# Patient Record
Sex: Male | Born: 1935 | Race: White | Hispanic: No | Marital: Married | Smoking: Former smoker
Health system: Southern US, Community
[De-identification: ages and names within clinical notes are randomized; demographics above are authoritative.]

## PROBLEM LIST (undated history)

## (undated) DIAGNOSIS — I350 Nonrheumatic aortic (valve) stenosis: Secondary | ICD-10-CM

## (undated) DIAGNOSIS — I340 Nonrheumatic mitral (valve) insufficiency: Secondary | ICD-10-CM

## (undated) DIAGNOSIS — I444 Left anterior fascicular block: Secondary | ICD-10-CM

## (undated) DIAGNOSIS — I6322 Cerebral infarction due to unspecified occlusion or stenosis of basilar arteries: Secondary | ICD-10-CM

## (undated) DIAGNOSIS — E785 Hyperlipidemia, unspecified: Secondary | ICD-10-CM

## (undated) DIAGNOSIS — I4510 Unspecified right bundle-branch block: Secondary | ICD-10-CM

## (undated) DIAGNOSIS — I779 Disorder of arteries and arterioles, unspecified: Secondary | ICD-10-CM

## (undated) DIAGNOSIS — M19019 Primary osteoarthritis, unspecified shoulder: Secondary | ICD-10-CM

## (undated) DIAGNOSIS — B029 Zoster without complications: Secondary | ICD-10-CM

## (undated) DIAGNOSIS — I63219 Cerebral infarction due to unspecified occlusion or stenosis of unspecified vertebral arteries: Secondary | ICD-10-CM

## (undated) DIAGNOSIS — J189 Pneumonia, unspecified organism: Secondary | ICD-10-CM

## (undated) DIAGNOSIS — F0390 Unspecified dementia without behavioral disturbance: Secondary | ICD-10-CM

## (undated) DIAGNOSIS — H43391 Other vitreous opacities, right eye: Secondary | ICD-10-CM

## (undated) DIAGNOSIS — I739 Peripheral vascular disease, unspecified: Secondary | ICD-10-CM

## (undated) HISTORY — DX: Primary osteoarthritis, unspecified shoulder: M19.019

## (undated) HISTORY — DX: Pneumonia, unspecified organism: J18.9

## (undated) HISTORY — PX: TONSILLECTOMY: SUR1361

## (undated) HISTORY — DX: Unspecified right bundle-branch block: I45.10

## (undated) HISTORY — DX: Unspecified dementia, unspecified severity, without behavioral disturbance, psychotic disturbance, mood disturbance, and anxiety: F03.90

## (undated) HISTORY — DX: Hyperlipidemia, unspecified: E78.5

## (undated) HISTORY — DX: Left anterior fascicular block: I44.4

## (undated) HISTORY — DX: Peripheral vascular disease, unspecified: I73.9

## (undated) HISTORY — DX: Cerebral infarction due to unspecified occlusion or stenosis of unspecified vertebral artery: I63.219

## (undated) HISTORY — DX: Disorder of arteries and arterioles, unspecified: I77.9

## (undated) HISTORY — DX: Other vitreous opacities, right eye: H43.391

## (undated) HISTORY — PX: AORTIC VALVE REPLACEMENT: SHX41

## (undated) HISTORY — DX: Zoster without complications: B02.9

## (undated) HISTORY — DX: Nonrheumatic mitral (valve) insufficiency: I34.0

## (undated) HISTORY — DX: Cerebral infarction due to unspecified occlusion or stenosis of basilar artery: I63.22

## (undated) HISTORY — DX: Nonrheumatic aortic (valve) stenosis: I35.0

---

## 1997-11-06 ENCOUNTER — Ambulatory Visit (HOSPITAL_COMMUNITY): Admission: RE | Admit: 1997-11-06 | Discharge: 1997-11-06 | Payer: Self-pay | Admitting: Internal Medicine

## 1997-12-04 ENCOUNTER — Ambulatory Visit (HOSPITAL_COMMUNITY): Admission: RE | Admit: 1997-12-04 | Discharge: 1997-12-04 | Payer: Self-pay | Admitting: Cardiology

## 1998-10-01 ENCOUNTER — Encounter: Payer: Self-pay | Admitting: Surgery

## 1998-10-02 ENCOUNTER — Encounter: Payer: Self-pay | Admitting: Surgery

## 1998-10-02 ENCOUNTER — Inpatient Hospital Stay (HOSPITAL_COMMUNITY): Admission: AD | Admit: 1998-10-02 | Discharge: 1998-10-08 | Payer: Self-pay | Admitting: Cardiology

## 1998-10-03 ENCOUNTER — Encounter: Payer: Self-pay | Admitting: Surgery

## 1998-10-04 ENCOUNTER — Encounter: Payer: Self-pay | Admitting: Surgery

## 1998-11-01 ENCOUNTER — Encounter: Payer: Self-pay | Admitting: Surgery

## 1998-11-01 ENCOUNTER — Encounter: Admission: RE | Admit: 1998-11-01 | Discharge: 1998-11-01 | Payer: Self-pay | Admitting: Surgery

## 2002-11-06 HISTORY — PX: COLONOSCOPY: SHX174

## 2004-10-29 ENCOUNTER — Ambulatory Visit: Payer: Self-pay | Admitting: Family Medicine

## 2005-11-02 ENCOUNTER — Ambulatory Visit: Payer: Self-pay | Admitting: Family Medicine

## 2005-11-02 LAB — CONVERTED CEMR LAB
AST: 20 units/L (ref 0–37)
CO2: 31 meq/L (ref 19–32)
Chloride: 103 meq/L (ref 96–112)
Chol/HDL Ratio, serum: 4.3
Cholesterol: 191 mg/dL (ref 0–200)
Creatinine, Ser: 1 mg/dL (ref 0.4–1.5)
HDL: 44.3 mg/dL (ref >39.0)
Hgb A1c MFr Bld: 7.2 % — ABNORMAL HIGH (ref 4.6–6.0)
LDL Cholesterol: 112 mg/dL — ABNORMAL HIGH (ref 0–99)
PSA: 0.49 ng/mL (ref 0.10–4.00)
Potassium: 4 meq/L (ref 3.5–5.1)
Sodium: 141 meq/L (ref 135–145)
Total Bilirubin: 0.5 mg/dL (ref 0.3–1.2)
Total Protein: 7.1 g/dL (ref 6.0–8.3)
Triglyceride fasting, serum: 172 mg/dL — ABNORMAL HIGH (ref 0–149)

## 2006-01-05 DIAGNOSIS — I6381 Other cerebral infarction due to occlusion or stenosis of small artery: Secondary | ICD-10-CM

## 2006-01-05 DIAGNOSIS — I63219 Cerebral infarction due to unspecified occlusion or stenosis of unspecified vertebral arteries: Secondary | ICD-10-CM

## 2006-01-05 HISTORY — DX: Cerebral infarction due to unspecified occlusion or stenosis of unspecified vertebral artery: I63.219

## 2006-01-05 HISTORY — DX: Other cerebral infarction due to occlusion or stenosis of small artery: I63.81

## 2006-03-31 ENCOUNTER — Ambulatory Visit: Payer: Self-pay | Admitting: Family Medicine

## 2006-10-08 DIAGNOSIS — E119 Type 2 diabetes mellitus without complications: Secondary | ICD-10-CM | POA: Insufficient documentation

## 2006-11-04 ENCOUNTER — Ambulatory Visit: Payer: Self-pay | Admitting: Family Medicine

## 2006-11-04 DIAGNOSIS — I38 Endocarditis, valve unspecified: Secondary | ICD-10-CM | POA: Insufficient documentation

## 2006-11-04 DIAGNOSIS — I10 Essential (primary) hypertension: Secondary | ICD-10-CM

## 2007-01-27 ENCOUNTER — Encounter: Payer: Self-pay | Admitting: Internal Medicine

## 2007-01-27 ENCOUNTER — Ambulatory Visit: Payer: Self-pay | Admitting: Cardiology

## 2007-01-27 ENCOUNTER — Inpatient Hospital Stay (HOSPITAL_COMMUNITY): Admission: EM | Admit: 2007-01-27 | Discharge: 2007-01-29 | Payer: Self-pay | Admitting: Emergency Medicine

## 2007-01-27 ENCOUNTER — Ambulatory Visit: Payer: Self-pay | Admitting: Internal Medicine

## 2007-01-27 ENCOUNTER — Encounter: Payer: Self-pay | Admitting: Family Medicine

## 2007-01-28 ENCOUNTER — Ambulatory Visit: Payer: Self-pay | Admitting: Vascular Surgery

## 2007-01-29 ENCOUNTER — Encounter: Payer: Self-pay | Admitting: Family Medicine

## 2007-02-01 ENCOUNTER — Ambulatory Visit: Payer: Self-pay | Admitting: Family Medicine

## 2007-02-01 DIAGNOSIS — I6350 Cerebral infarction due to unspecified occlusion or stenosis of unspecified cerebral artery: Secondary | ICD-10-CM

## 2007-02-02 ENCOUNTER — Encounter: Admission: RE | Admit: 2007-02-02 | Discharge: 2007-02-03 | Payer: Self-pay | Admitting: Family

## 2007-08-02 ENCOUNTER — Ambulatory Visit: Payer: Self-pay | Admitting: Family Medicine

## 2007-08-02 LAB — CONVERTED CEMR LAB: Blood Glucose, Fingerstick: 146

## 2007-08-18 ENCOUNTER — Telehealth (INDEPENDENT_AMBULATORY_CARE_PROVIDER_SITE_OTHER): Payer: Self-pay | Admitting: *Deleted

## 2007-11-15 ENCOUNTER — Encounter: Payer: Self-pay | Admitting: Family Medicine

## 2007-11-30 ENCOUNTER — Ambulatory Visit: Payer: Self-pay | Admitting: Family Medicine

## 2007-12-14 ENCOUNTER — Telehealth: Payer: Self-pay | Admitting: Family Medicine

## 2007-12-19 ENCOUNTER — Encounter: Payer: Self-pay | Admitting: Family Medicine

## 2007-12-20 ENCOUNTER — Ambulatory Visit: Payer: Self-pay | Admitting: Vascular Surgery

## 2008-01-13 ENCOUNTER — Inpatient Hospital Stay (HOSPITAL_COMMUNITY): Admission: RE | Admit: 2008-01-13 | Discharge: 2008-01-15 | Payer: Self-pay | Admitting: Vascular Surgery

## 2008-01-13 ENCOUNTER — Encounter: Payer: Self-pay | Admitting: Vascular Surgery

## 2008-01-13 ENCOUNTER — Ambulatory Visit: Payer: Self-pay | Admitting: Vascular Surgery

## 2008-01-13 HISTORY — PX: CAROTID ENDARTERECTOMY: SUR193

## 2008-01-24 ENCOUNTER — Ambulatory Visit: Payer: Self-pay | Admitting: Vascular Surgery

## 2008-01-31 ENCOUNTER — Encounter: Payer: Self-pay | Admitting: Family Medicine

## 2008-02-01 ENCOUNTER — Telehealth: Payer: Self-pay | Admitting: Family Medicine

## 2008-05-11 ENCOUNTER — Ambulatory Visit: Payer: Self-pay | Admitting: Family Medicine

## 2008-05-11 DIAGNOSIS — R209 Unspecified disturbances of skin sensation: Secondary | ICD-10-CM

## 2008-05-11 DIAGNOSIS — I739 Peripheral vascular disease, unspecified: Secondary | ICD-10-CM

## 2008-05-14 ENCOUNTER — Telehealth: Payer: Self-pay | Admitting: Family Medicine

## 2008-05-17 ENCOUNTER — Telehealth: Payer: Self-pay | Admitting: *Deleted

## 2008-05-21 ENCOUNTER — Ambulatory Visit: Payer: Self-pay

## 2008-05-21 ENCOUNTER — Encounter: Payer: Self-pay | Admitting: Family Medicine

## 2008-07-24 ENCOUNTER — Ambulatory Visit: Payer: Self-pay | Admitting: Vascular Surgery

## 2008-11-07 ENCOUNTER — Ambulatory Visit: Payer: Self-pay | Admitting: Family Medicine

## 2008-12-03 ENCOUNTER — Ambulatory Visit: Payer: Self-pay | Admitting: Family Medicine

## 2009-01-22 ENCOUNTER — Ambulatory Visit: Payer: Self-pay | Admitting: Vascular Surgery

## 2009-01-24 ENCOUNTER — Encounter: Payer: Self-pay | Admitting: Family Medicine

## 2009-01-28 ENCOUNTER — Encounter: Payer: Self-pay | Admitting: Family Medicine

## 2009-04-05 DIAGNOSIS — J189 Pneumonia, unspecified organism: Secondary | ICD-10-CM

## 2009-04-05 HISTORY — DX: Pneumonia, unspecified organism: J18.9

## 2009-04-08 ENCOUNTER — Inpatient Hospital Stay (HOSPITAL_COMMUNITY): Admission: EM | Admit: 2009-04-08 | Discharge: 2009-04-10 | Payer: Self-pay | Admitting: Emergency Medicine

## 2009-04-17 ENCOUNTER — Ambulatory Visit: Payer: Self-pay | Admitting: Family Medicine

## 2009-04-17 DIAGNOSIS — J189 Pneumonia, unspecified organism: Secondary | ICD-10-CM | POA: Insufficient documentation

## 2009-04-17 DIAGNOSIS — J984 Other disorders of lung: Secondary | ICD-10-CM

## 2009-04-22 ENCOUNTER — Ambulatory Visit: Payer: Self-pay | Admitting: Cardiology

## 2009-04-25 ENCOUNTER — Telehealth: Payer: Self-pay | Admitting: Family Medicine

## 2009-04-29 ENCOUNTER — Encounter: Payer: Self-pay | Admitting: Family Medicine

## 2009-05-14 ENCOUNTER — Ambulatory Visit: Payer: Self-pay | Admitting: Family Medicine

## 2009-05-21 ENCOUNTER — Encounter: Payer: Self-pay | Admitting: Family Medicine

## 2009-05-23 ENCOUNTER — Encounter: Payer: Self-pay | Admitting: Family Medicine

## 2009-05-23 ENCOUNTER — Ambulatory Visit: Payer: Self-pay

## 2009-05-29 ENCOUNTER — Telehealth: Payer: Self-pay | Admitting: Family Medicine

## 2009-09-23 ENCOUNTER — Ambulatory Visit: Payer: Self-pay | Admitting: Family Medicine

## 2009-09-23 DIAGNOSIS — R42 Dizziness and giddiness: Secondary | ICD-10-CM

## 2009-09-25 LAB — CONVERTED CEMR LAB
ALT: 20 units/L (ref 0–53)
AST: 27 units/L (ref 0–37)
BUN: 19 mg/dL (ref 6–23)
Bilirubin, Direct: 0.1 mg/dL (ref 0.0–0.3)
Calcium: 9.2 mg/dL (ref 8.4–10.5)
Creatinine, Ser: 1 mg/dL (ref 0.4–1.5)
Eosinophils Relative: 0.5 % (ref 0.0–5.0)
GFR calc non Af Amer: 78 mL/min (ref >60)
Lymphocytes Relative: 28.7 % (ref 12.0–46.0)
Monocytes Relative: 7.8 % (ref 3.0–12.0)
Neutrophils Relative %: 62.8 % (ref 43.0–77.0)
Platelets: 167 10*3/uL (ref 150.0–400.0)
Potassium: 4.3 meq/L (ref 3.5–5.1)
Total Bilirubin: 0.5 mg/dL (ref 0.3–1.2)
WBC: 7.8 10*3/uL (ref 4.5–10.5)

## 2009-12-05 ENCOUNTER — Ambulatory Visit: Payer: Self-pay | Admitting: Family Medicine

## 2009-12-05 DIAGNOSIS — N401 Enlarged prostate with lower urinary tract symptoms: Secondary | ICD-10-CM

## 2009-12-06 LAB — CONVERTED CEMR LAB
AST: 21 units/L (ref 0–37)
Albumin: 4.3 g/dL (ref 3.5–5.2)
Alkaline Phosphatase: 69 units/L (ref 39–117)
Basophils Absolute: 0 10*3/uL (ref 0.0–0.1)
Bilirubin, Direct: 0.1 mg/dL (ref 0.0–0.3)
Calcium: 9.4 mg/dL (ref 8.4–10.5)
Creatinine, Ser: 1.1 mg/dL (ref 0.4–1.5)
Eosinophils Absolute: 0.1 10*3/uL (ref 0.0–0.7)
GFR calc non Af Amer: 69.62 mL/min (ref >60)
Glucose, Bld: 161 mg/dL — ABNORMAL HIGH (ref 70–99)
HDL: 42.9 mg/dL (ref >39.00)
Hemoglobin: 12.8 g/dL — ABNORMAL LOW (ref 13.0–17.0)
Hgb A1c MFr Bld: 7.2 % — ABNORMAL HIGH (ref 4.6–6.5)
Lymphocytes Relative: 30.7 % (ref 12.0–46.0)
Monocytes Relative: 7.1 % (ref 3.0–12.0)
Neutro Abs: 4.1 10*3/uL (ref 1.4–7.7)
Neutrophils Relative %: 60.7 % (ref 43.0–77.0)
RDW: 14.5 % (ref 11.5–14.6)
Sodium: 138 meq/L (ref 135–145)
Total Bilirubin: 0.2 mg/dL — ABNORMAL LOW (ref 0.3–1.2)
Total CHOL/HDL Ratio: 3
Triglycerides: 106 mg/dL (ref 0.0–149.0)
VLDL: 21.2 mg/dL (ref 0.0–40.0)

## 2010-01-17 ENCOUNTER — Ambulatory Visit
Admission: RE | Admit: 2010-01-17 | Discharge: 2010-01-17 | Payer: Self-pay | Source: Home / Self Care | Attending: Vascular Surgery | Admitting: Vascular Surgery

## 2010-02-02 LAB — CONVERTED CEMR LAB
ALT: 13 units/L (ref 0–53)
ALT: 17 units/L (ref 0–53)
AST: 21 units/L (ref 0–37)
Albumin: 4.4 g/dL (ref 3.5–5.2)
Alkaline Phosphatase: 57 units/L (ref 39–117)
BUN: 17 mg/dL (ref 6–23)
Basophils Relative: 0.3 % (ref 0.0–1.0)
Bilirubin Urine: NEGATIVE
Bilirubin, Direct: 0.1 mg/dL (ref 0.0–0.3)
Bilirubin, Direct: 0.2 mg/dL (ref 0.0–0.3)
CO2: 29 meq/L (ref 19–32)
Calcium: 9.8 mg/dL (ref 8.4–10.5)
Chloride: 101 meq/L (ref 96–112)
Chloride: 104 meq/L (ref 96–112)
Cholesterol: 190 mg/dL (ref 0–200)
Creatinine, Ser: 0.9 mg/dL (ref 0.4–1.5)
Eosinophils Absolute: 0 10*3/uL (ref 0.0–0.7)
Eosinophils Relative: 0.6 % (ref 0.0–5.0)
Eosinophils Relative: 0.9 % (ref 0.0–5.0)
GFR calc Af Amer: 103 mL/min
GFR calc non Af Amer: 85 mL/min
GFR calc non Af Amer: 86 mL/min
Glucose, Bld: 141 mg/dL — ABNORMAL HIGH (ref 70–99)
Glucose, Bld: 143 mg/dL — ABNORMAL HIGH (ref 70–99)
HCT: 40.1 % (ref 39.0–52.0)
HCT: 40.6 % (ref 39.0–52.0)
HCT: 40.8 % (ref 39.0–52.0)
HDL: 47.1 mg/dL (ref >39.0)
Hgb A1c MFr Bld: 6.8 % — ABNORMAL HIGH (ref 4.6–6.5)
Hgb A1c MFr Bld: 7.1 % — ABNORMAL HIGH (ref 4.6–6.0)
Hgb A1c MFr Bld: 7.6 % — ABNORMAL HIGH (ref 4.6–6.0)
Ketones, urine, test strip: NEGATIVE
Ketones, urine, test strip: NEGATIVE
LDL Cholesterol: 113 mg/dL — ABNORMAL HIGH (ref 0–99)
Lymphs Abs: 1.8 10*3/uL (ref 0.7–4.0)
MCV: 88.9 fL (ref 78.0–100.0)
MCV: 90.5 fL (ref 78.0–100.0)
MCV: 94.5 fL (ref 78.0–100.0)
Microalb Creat Ratio: 24.9 mg/g (ref 0.0–30.0)
Monocytes Absolute: 0.4 10*3/uL (ref 0.1–1.0)
Monocytes Absolute: 0.5 10*3/uL (ref 0.1–1.0)
Monocytes Relative: 7.5 % (ref 3.0–12.0)
Neutrophils Relative %: 58.8 % (ref 43.0–77.0)
Neutrophils Relative %: 61.3 % (ref 43.0–77.0)
Nitrite: POSITIVE
Nitrite: POSITIVE
PSA: 0.45 ng/mL (ref 0.10–4.00)
Platelets: 166 10*3/uL (ref 150.0–400.0)
Platelets: 168 10*3/uL (ref 150–400)
Platelets: 189 10*3/uL (ref 150–400)
Potassium: 4.6 meq/L (ref 3.5–5.1)
Potassium: 4.9 meq/L (ref 3.5–5.1)
RBC: 4.59 M/uL (ref 4.22–5.81)
RDW: 13.3 % (ref 11.5–14.6)
RDW: 13.3 % (ref 11.5–14.6)
Sodium: 141 meq/L (ref 135–145)
Sodium: 144 meq/L (ref 135–145)
Specific Gravity, Urine: 1.02
Specific Gravity, Urine: 1.025
TSH: 0.24 microintl units/mL — ABNORMAL LOW (ref 0.35–5.50)
TSH: 0.99 microintl units/mL (ref 0.35–5.50)
Total Bilirubin: 0.7 mg/dL (ref 0.3–1.2)
Total Bilirubin: 0.9 mg/dL (ref 0.3–1.2)
Total CHOL/HDL Ratio: 3.7
Total CHOL/HDL Ratio: 3.9
Total Protein: 6.9 g/dL (ref 6.0–8.3)
Total Protein: 7.2 g/dL (ref 6.0–8.3)
Triglycerides: 129 mg/dL (ref 0–149)
Triglycerides: 141 mg/dL (ref 0–149)
Urobilinogen, UA: 0.2
VLDL: 26 mg/dL (ref 0–40)
VLDL: 28 mg/dL (ref 0–40)
WBC: 6 10*3/uL (ref 4.5–10.5)
WBC: 6.5 10*3/uL (ref 4.5–10.5)
pH: 7

## 2010-02-04 NOTE — Assessment & Plan Note (Signed)
Summary: POST HOSP F/U (PNEUMONIA) // RS   Vital Signs:  Patient profile:   77 year old male Weight:      160 pounds O2 Sat:      97 % on Room air Temp:     97.9 degrees F oral Pulse rate:   84 / minute Pulse rhythm:   regular BP sitting:   128 / 50  (left arm) Cuff size:   regular  Vitals Entered By: Raechel Ache, RN (April 17, 2009 1:06 PM)  O2 Flow:  Room air CC: Hosp f/u; feels ok.   History of Present Illness: Here with his wife to follow up a hospital stay from 04-08-09 to 04-10-09 for a LLL pneumonia. This was treated with Avelox, and he thinks it has totally resolved. No cough or fever or chest pain or SOB. His CXR then also showed a density in the RUL which has not been seen before. This needs to be worked up further. He had some altered mental status for a few days, but this improved after the pneumonia was treated. A head CT was unremarkable.   Allergies: 1)  ! Pcn  Past History:  Past Medical History: Reviewed history from 12/03/2008 and no changes required. Right  Eye Floaters High Cholesterol DJD Shoulder Severe Aortic Stenosis, sees Dr. Peter [place] Mitral Insufficiency  RBBB & LAFB shingles Diabetes mellitus, type II carotid artery disease left thalamic stroke 1-08   Past Surgical History: Reviewed history from 05/11/2008 and no changes required. Tonsillectomy Colonoscopy 11-04 per Dr. Jarold Motto Cataract extractions per Dr. Harriette Bouillon Aortic Valve Replacement: used bovine valve, in 2000 per Dr. Laneta Simmers Carotid endarterectomy, left, 01-13-08 per Dr. Hart Rochester  Review of Systems  The patient denies anorexia, fever, weight loss, weight gain, vision loss, decreased hearing, hoarseness, chest pain, syncope, dyspnea on exertion, peripheral edema, prolonged cough, headaches, hemoptysis, abdominal pain, melena, hematochezia, severe indigestion/heartburn, hematuria, incontinence, genital sores, muscle weakness, suspicious skin lesions, transient blindness, difficulty  walking, depression, unusual weight change, abnormal bleeding, enlarged lymph nodes, angioedema, breast masses, and testicular masses.    Physical Exam  General:  Well-developed,well-nourished,in no acute distress; alert,appropriate and cooperative throughout examination Neck:  No deformities, masses, or tenderness noted. Lungs:  Normal respiratory effort, chest expands symmetrically. Lungs are clear to auscultation, no crackles or wheezes. Heart:  Normal rate and regular rhythm. S1 and S2 normal without gallop, murmur, click, rub or other extra sounds. Neurologic:  alert & oriented X3, cranial nerves II-XII intact, and gait normal.     Impression & Recommendations:  Problem # 1:  PNEUMONIA (ICD-486)  Problem # 2:  PULMONARY NODULE (WJX-914.78)  Orders: Radiology Referral (Radiology)  Problem # 3:  DIABETES MELLITUS, TYPE II (ICD-250.00)  His updated medication list for this problem includes:    Glyburide 5 Mg Tabs (Glyburide) .Marland Kitchen..Marland Kitchen Two times a day    Lisinopril 20 Mg Tabs (Lisinopril) .Marland Kitchen... 1 by mouth once daily    Metformin Hcl 1000 Mg Tabs (Metformin hcl) .Marland Kitchen... 1 tab two times a day    Januvia 100 Mg Tabs (Sitagliptin phosphate) ..... Once daily    Bayer Aspirin 325 Mg Tabs (Aspirin) ..... Once daily  Orders: Prescription Created Electronically [phone])  Complete Medication List: 1)  Glyburide 5 Mg Tabs (Glyburide) .... Two times a day 2)  Lisinopril 20 Mg Tabs (Lisinopril) .Marland Kitchen.. 1 by mouth once daily 3)  Metformin Hcl 1000 Mg Tabs (Metformin hcl) .Marland Kitchen.. 1 tab two times a day 4)  Januvia 100 Mg Tabs (  Sitagliptin phosphate) .... Once daily 5)  Onetouch Test Strp (Glucose blood) .... Use once daily 6)  Ocuvite-lutein Caps (Multiple vitamins-minerals) .Marland Kitchen.. 1 by mouth once daily 7)  Bayer Aspirin 325 Mg Tabs (Aspirin) .... Once daily 8)  Simvastatin 40 Mg Tabs (Simvastatin) .Marland Kitchen.. 1 once daily  Patient Instructions: 1)  His pneumonia seems to have resolved. We will set up a chest  CT soon to work up the lung density further. Get labs today including an A1c. Prescriptions: ONETOUCH TEST   STRP (GLUCOSE BLOOD) use once daily  #100 x 3   Entered and Authorized by:   [name] MD   Signed by:   [name] MD on 04/17/2009   Method used:   Electronically to        [facility] [phone]* (retail)       [address]       [place], [place]  [address]       Ph: [phone]       Fax: [phone]   RxID:   [id number]

## 2010-02-04 NOTE — Assessment & Plan Note (Signed)
Summary: PT FELL ON FRIDAY/PT DIZZY/CJR   Vital Signs:  Patient profile:   77 year old male Weight:      161 pounds O2 Sat:      98 % Temp:     98.2 degrees F Pulse rate:   90 / minute BP sitting:   150 / 60  (left arm)  Vitals Entered By: Pura Spice, RN (September 23, 2009 11:08 AM) CC: fell friday hit head per wife did not find any bumps on head and did not seek medical attn. c/o unsteady gait .BS running high per wife this am FBS 182   History of Present Illness: Here with his wifr for dizzy spells which he describes as the room moving around him. This is worst when he moves but he sometimes feels this even when lying still on his bed. No HA or SOB or chest pains. His  glucoses have been more labile lately, and some of his am fasting glucoses have been in the mid 200s. He has borrowed a rolling walker from a neighbor and has used this for a few days. This is very helpful for him to get around with.   Allergies: 1)  ! Pcn  Past History:  Past Medical History: Reviewed history from 12/03/2008 and no changes required. Right  Eye Floaters High Cholesterol DJD Shoulder Severe Aortic Stenosis, sees Dr. Peter [place] Mitral Insufficiency  RBBB & LAFB shingles Diabetes mellitus, type II carotid artery disease left thalamic stroke 1-08   Review of Systems  The patient denies anorexia, fever, weight loss, weight gain, vision loss, decreased hearing, hoarseness, chest pain, syncope, dyspnea on exertion, peripheral edema, prolonged cough, headaches, hemoptysis, abdominal pain, melena, hematochezia, severe indigestion/heartburn, hematuria, incontinence, genital sores, muscle weakness, suspicious skin lesions, transient blindness, difficulty walking, depression, unusual weight change, abnormal bleeding, enlarged lymph nodes, angioedema, breast masses, and testicular masses.    Physical Exam  General:  alert, walks slowly with a walker Neck:  No deformities, masses, or tenderness  noted. Lungs:  Normal respiratory effort, chest expands symmetrically. Lungs are clear to auscultation, no crackles or wheezes. Heart:  Normal rate and regular rhythm. S1 and S2 normal without gallop,  click, rub or other extra sounds. Has his usual 2/6 SM  Neurologic:  alert & oriented X3, cranial nerves II-XII intact, and strength normal in all extremities.     Impression & Recommendations:  Problem # 1:  DIZZINESS (ICD-780.4)  His updated medication list for this problem includes:    Meclizine Hcl 25 Mg Tabs (Meclizine hcl) .Marland Kitchen... 1 q 4 hours as needed dizziness  Problem # 2:  CAROTID ARTERY DISEASE (ICD-433.10)  His updated medication list for this problem includes:    Bayer Aspirin 325 Mg Tabs (Aspirin) ..... Once daily  Problem # 3:  CEREBROVASCULAR ACCIDENT (ICD-434.91)  His updated medication list for this problem includes:    Bayer Aspirin 325 Mg Tabs (Aspirin) ..... Once daily  Problem # 4:  HYPERTENSION, ESSENTIAL NOS (ICD-401.9)  His updated medication list for this problem includes:    Lisinopril 20 Mg Tabs (Lisinopril) .Marland Kitchen... 1 by mouth once daily  Problem # 5:  DIABETES MELLITUS, TYPE II (ICD-250.00)  His updated medication list for this problem includes:    Glyburide 5 Mg Tabs (Glyburide) .Marland Kitchen..Marland Kitchen Two times a day    Lisinopril 20 Mg Tabs (Lisinopril) .Marland Kitchen... 1 by mouth once daily    Metformin Hcl 1000 Mg Tabs (Metformin hcl) .Marland Kitchen... 1 tab two times a day  Januvia 100 Mg Tabs (Sitagliptin phosphate) ..... Once daily    Bayer Aspirin 325 Mg Tabs (Aspirin) ..... Once daily  Orders: Venipuncture (47829) TLB-BMP (Basic Metabolic Panel-BMET) (80048-METABOL) TLB-CBC Platelet - w/Differential (85025-CBCD) TLB-Hepatic/Liver Function Pnl (80076-HEPATIC) TLB-TSH (Thyroid Stimulating Hormone) (84443-TSH) TLB-A1C / Hgb A1C (Glycohemoglobin) (83036-A1C)  Complete Medication List: 1)  Glyburide 5 Mg Tabs (Glyburide) .... Two times a day 2)  Lisinopril 20 Mg Tabs (Lisinopril)  .Marland Kitchen.. 1 by mouth once daily 3)  Metformin Hcl 1000 Mg Tabs (Metformin hcl) .Marland Kitchen.. 1 tab two times a day 4)  Januvia 100 Mg Tabs (Sitagliptin phosphate) .... Once daily 5)  Onetouch Test Strp (Glucose blood) .... Use once daily 6)  Ocuvite-lutein Caps (Multiple vitamins-minerals) .Marland Kitchen.. 1 by mouth once daily 7)  Bayer Aspirin 325 Mg Tabs (Aspirin) .... Once daily 8)  Simvastatin 40 Mg Tabs (Simvastatin) .Marland Kitchen.. 1 once daily 9)  Meclizine Hcl 25 Mg Tabs (Meclizine hcl) .Marland Kitchen.. 1 q 4 hours as needed dizziness  Patient Instructions: 1)  get labs including an A1c. Encouraged him to use the walker at all times. Try meclizine as needed . Prescriptions: MECLIZINE HCL 25 MG TABS (MECLIZINE HCL) 1 q 4 hours as needed dizziness  #60 x 5   Entered and Authorized by:   [name] MD   Signed by:   [name] MD on 09/23/2009   Method used:   Electronically to        [facility]  [address] [phone]* (retail)       [address]       [place], [place]  [address]       Ph: [phone]       Fax: [phone]   RxID:   [id number]   Appended Document: Orders Update    Clinical Lists Changes  Orders: Added new Service order of Specimen Handling (95638) - Signed

## 2010-02-04 NOTE — Miscellaneous (Signed)
Summary: Certification and Plan of Care/[facility]  Certification and Plan of Care/[facility]   Imported By: Maryln Gottron 05/21/2009 09:59:02  _____________________________________________________________________  External Attachment:    Type:   Image     Comment:   External Document

## 2010-02-04 NOTE — Progress Notes (Signed)
Summary: returning a call  Phone Note Call from Patient Call back at Home Phone [phone]   Caller: Patient--live call Reason for Call: Talk to Nurse, Lab or Test Results Summary of Call: returning a call about doppler results? Initial call taken by: Warnell Forester,  May 29, 2009 9:57 AM  Follow-up for Phone Call        report called. Follow-up by: Raechel Ache, RN,  May 29, 2009 9:58 AM

## 2010-02-04 NOTE — Assessment & Plan Note (Signed)
Summary: CPX LABS/V70.0/CJR   Vital Signs:  Patient profile:   77 year old male Height:      70 inches Weight:      160 pounds BMI:     23.04 O2 Sat:      96 % Temp:     97.6 degrees F Pulse rate:   90 / minute BP sitting:   130 / 70  Vitals Entered By: Pura Spice, RN (December 05, 2009 8:56 AM) CC: cpx  fasting    History of Present Illness: 77 yr old male for a cpx.   Allergies: 1)  ! Pcn  Past History:  Past Medical History: Right  Eye Floaters High Cholesterol DJD Shoulder Severe Aortic Stenosis, sees Dr. Peter [place] Mitral Insufficiency  RBBB & LAFB shingles Diabetes mellitus, type II carotid artery disease, last dopplers on 05-23-09 were stable, LICA with 0-39% stenosis and RICA with 40-59% left thalamic stroke 1-08  pneumonia 04-2009  Past Surgical History: Tonsillectomy Colonoscopy 11-04 per Dr. Jarold Motto, clear, no repeats needed Cataract extractions per Dr. Harriette Bouillon Aortic Valve Replacement: used bovine valve, in 2000 per Dr. Laneta Simmers Carotid endarterectomy, left, 01-13-08 per Dr. Hart Rochester  Past History:  Care Management: Cardiology: Dr P [place]   Family History: Reviewed history from 11/04/2006 and no changes required. unremarkable  Social History: Reviewed history from 10/08/2006 and no changes required. Retired Married Former Smoker Alcohol use-no  Review of Systems  The patient denies anorexia, fever, weight loss, weight gain, vision loss, decreased hearing, hoarseness, chest pain, syncope, dyspnea on exertion, peripheral edema, prolonged cough, headaches, hemoptysis, abdominal pain, melena, hematochezia, severe indigestion/heartburn, hematuria, incontinence, genital sores, muscle weakness, suspicious skin lesions, transient blindness, difficulty walking, depression, unusual weight change, abnormal bleeding, enlarged lymph nodes, angioedema, breast masses, and testicular masses.    Physical Exam  General:  Well-developed,well-nourished,in  no acute distress; alert,appropriate and cooperative throughout examination Head:  Normocephalic and atraumatic without obvious abnormalities. No apparent alopecia or balding. Eyes:  No corneal or conjunctival inflammation noted. EOMI. Perrla. Funduscopic exam benign, without hemorrhages, exudates or papilledema. Vision grossly normal. Ears:  External ear exam shows no significant lesions or deformities.  Otoscopic examination reveals clear canals, tympanic membranes are intact bilaterally without bulging, retraction, inflammation or discharge. Hearing is grossly normal bilaterally. Nose:  External nasal examination shows no deformity or inflammation. Nasal mucosa are pink and moist without lesions or exudates. Mouth:  Oral mucosa and oropharynx without lesions or exudates.  Teeth in good repair. Neck:  No deformities, masses, or tenderness noted. No bruits  Chest Wall:  No deformities, masses, tenderness or gynecomastia noted. Lungs:  Normal respiratory effort, chest expands symmetrically. Lungs are clear to auscultation, no crackles or wheezes. Heart:  normal rate, regular rhythm, no gallop, no rub, no JVD, and no HJR.  Has his usual 2/6 SM at the aortic area  Abdomen:  Bowel sounds positive,abdomen soft and non-tender without masses, organomegaly or hernias noted. Rectal:  No external abnormalities noted. Normal sphincter tone. No rectal masses or tenderness. Heme neg. Genitalia:  Testes bilaterally descended without nodularity, tenderness or masses. No scrotal masses or lesions. No penis lesions or urethral discharge. Prostate:  no nodules, no asymmetry, no induration, and 2+ enlarged.   Msk:  No deformity or scoliosis noted of thoracic or lumbar spine.   Pulses:  R and L carotid,radial,femoral,dorsalis pedis and posterior tibial pulses are full and equal bilaterally Extremities:  No clubbing, cyanosis, edema, or deformity noted with normal full range of motion  of all joints.   Neurologic:  No  cranial nerve deficits noted. Station and gait are normal. Plantar reflexes are down-going bilaterally. DTRs are symmetrical throughout. Sensory, motor and coordinative functions appear intact. Skin:  Intact without suspicious lesions or rashes Cervical Nodes:  No lymphadenopathy noted Axillary Nodes:  No palpable lymphadenopathy Inguinal Nodes:  No significant adenopathy Psych:  Cognition and judgment appear intact. Alert and cooperative with normal attention span and concentration. No apparent delusions, illusions, hallucinations   Impression & Recommendations:  Problem # 1:  CAROTID ARTERY DISEASE (ICD-433.10)  His updated medication list for this problem includes:    Bayer Aspirin 325 Mg Tabs (Aspirin) ..... Once daily  Problem # 2:  CEREBROVASCULAR ACCIDENT (ICD-434.91)  His updated medication list for this problem includes:    Bayer Aspirin 325 Mg Tabs (Aspirin) ..... Once daily  Problem # 3:  BENIGN PROSTATIC HYPERTROPHY, WITH OBSTRUCTION (ICD-600.01)  Orders: TLB-PSA (Prostate Specific Antigen) (84153-PSA)  Problem # 4:  HYPERTENSION, ESSENTIAL NOS (ICD-401.9)  His updated medication list for this problem includes:    Lisinopril 20 Mg Tabs (Lisinopril) .Marland Kitchen... 1 by mouth once daily  Problem # 5:  DIABETES MELLITUS, TYPE II (ICD-250.00)  His updated medication list for this problem includes:    Glyburide 5 Mg Tabs (Glyburide) .Marland Kitchen..Marland Kitchen Two times a day    Lisinopril 20 Mg Tabs (Lisinopril) .Marland Kitchen... 1 by mouth once daily    Metformin Hcl 1000 Mg Tabs (Metformin hcl) .Marland Kitchen... 1 tab two times a day    Januvia 100 Mg Tabs (Sitagliptin phosphate) ..... Once daily    Bayer Aspirin 325 Mg Tabs (Aspirin) ..... Once daily  Orders: UA Dipstick w/o Micro (automated)  (81003) Venipuncture (16109) TLB-Lipid Panel (80061-LIPID) TLB-BMP (Basic Metabolic Panel-BMET) (80048-METABOL) TLB-CBC Platelet - w/Differential (85025-CBCD) TLB-Hepatic/Liver Function Pnl (80076-HEPATIC) TLB-TSH (Thyroid  Stimulating Hormone) (84443-TSH) TLB-A1C / Hgb A1C (Glycohemoglobin) (83036-A1C)  Problem # 6:  VALVULAR HEART DISEASE (ICD-424.90)  His updated medication list for this problem includes:    Bayer Aspirin 325 Mg Tabs (Aspirin) ..... Once daily  Complete Medication List: 1)  Glyburide 5 Mg Tabs (Glyburide) .... Two times a day 2)  Lisinopril 20 Mg Tabs (Lisinopril) .Marland Kitchen.. 1 by mouth once daily 3)  Metformin Hcl 1000 Mg Tabs (Metformin hcl) .Marland Kitchen.. 1 tab two times a day 4)  Januvia 100 Mg Tabs (Sitagliptin phosphate) .... Once daily 5)  Onetouch Test Strp (Glucose blood) .... Use once daily 6)  Ocuvite-lutein Caps (Multiple vitamins-minerals) .Marland Kitchen.. 1 by mouth once daily 7)  Bayer Aspirin 325 Mg Tabs (Aspirin) .... Once daily 8)  Simvastatin 40 Mg Tabs (Simvastatin) .Marland Kitchen.. 1 once daily 9)  Meclizine Hcl 25 Mg Tabs (Meclizine hcl) .Marland Kitchen.. 1 q 4 hours as needed dizziness  Patient Instructions: 1)  get fasting labs    Orders Added: 1)  Est. Patient Level IV [60454] 2)  UA Dipstick w/o Micro (automated)  [81003] 3)  Venipuncture [36415] 4)  TLB-Lipid Panel [80061-LIPID] 5)  TLB-BMP (Basic Metabolic Panel-BMET) [80048-METABOL] 6)  TLB-CBC Platelet - w/Differential [85025-CBCD] 7)  TLB-Hepatic/Liver Function Pnl [80076-HEPATIC] 8)  TLB-TSH (Thyroid Stimulating Hormone) [84443-TSH] 9)  TLB-PSA (Prostate Specific Antigen) [84153-PSA] 10)  TLB-A1C / Hgb A1C (Glycohemoglobin) [83036-A1C]   Immunization History:  Influenza Immunization History:    Influenza:  historical (12/05/2009)   Immunization History:  Influenza Immunization History:    Influenza:  Historical (12/05/2009)  Appended Document: CPX LABS/V70.0/CJR  Laboratory Results   Urine Tests    Routine Urinalysis   Color:  yellow Appearance: Clear Glucose: negative   (Normal Range: Negative) Bilirubin: negative   (Normal Range: Negative) Ketone: negative   (Normal Range: Negative) Spec. Gravity: 1.010   (Normal Range:  1.003-1.035) Blood: negative   (Normal Range: Negative) pH: 6.0   (Normal Range: 5.0-8.0) Protein: negative   (Normal Range: Negative) Urobilinogen: 0.2   (Normal Range: 0-1) Nitrite: negative   (Normal Range: Negative) Leukocyte Esterace: trace   (Normal Range: Negative)    Comments: Rita Ohara  December 05, 2009 1:28 PM

## 2010-02-04 NOTE — Progress Notes (Signed)
Summary: test results  Phone Note Call from Patient   Caller: Spouse Call For: [name] MD Summary of Call: calling for CT result. Initial call taken by: Raechel Ache, RN,  April 25, 2009 9:04 AM  Follow-up for Phone Call        see report Follow-up by: [name] MD,  April 29, 2009 9:32 AM  Additional Follow-up for Phone Call Additional follow up Details #1::        Phone Call Completed Additional Follow-up by: Raechel Ache, RN,  April 29, 2009 10:20 AM

## 2010-02-04 NOTE — Letter (Signed)
Summary: [facility]   Imported By: Maryln Gottron 01/31/2009 11:21:07  _____________________________________________________________________  External Attachment:    Type:   Image     Comment:   External Document

## 2010-02-04 NOTE — Letter (Signed)
Summary: [facility]   Imported By: Maryln Gottron 02/04/2009 11:12:03  _____________________________________________________________________  External Attachment:    Type:   Image     Comment:   External Document

## 2010-02-04 NOTE — Miscellaneous (Signed)
Summary: Orders Update  Clinical Lists Changes  Orders: Added new Test order of Carotid Duplex (Carotid Duplex) - Signed 

## 2010-03-26 LAB — LACTIC ACID, PLASMA: Lactic Acid, Venous: 1.9 mmol/L (ref 0.5–2.2)

## 2010-03-26 LAB — COMPREHENSIVE METABOLIC PANEL
AST: 15 U/L (ref 0–37)
BUN: 16 mg/dL (ref 6–23)
CO2: 24 mEq/L (ref 19–32)
Calcium: 8.5 mg/dL (ref 8.4–10.5)
Chloride: 104 mEq/L (ref 96–112)
Creatinine, Ser: 0.96 mg/dL (ref 0.4–1.5)
GFR calc non Af Amer: >60 mL/min (ref >60)
Glucose, Bld: 192 mg/dL — ABNORMAL HIGH (ref 70–99)
Total Bilirubin: 1 mg/dL (ref 0.3–1.2)

## 2010-03-26 LAB — URINE CULTURE

## 2010-03-26 LAB — IRON AND TIBC
Iron: 12 ug/dL — ABNORMAL LOW (ref 42–135)
Saturation Ratios: 5 % — ABNORMAL LOW (ref 20–55)
TIBC: 223 ug/dL (ref 215–435)
TIBC: 242 ug/dL (ref 215–435)

## 2010-03-26 LAB — GLUCOSE, CAPILLARY
Glucose-Capillary: 131 mg/dL — ABNORMAL HIGH (ref 70–99)
Glucose-Capillary: 164 mg/dL — ABNORMAL HIGH (ref 70–99)
Glucose-Capillary: 210 mg/dL — ABNORMAL HIGH (ref 70–99)
Glucose-Capillary: 211 mg/dL — ABNORMAL HIGH (ref 70–99)

## 2010-03-26 LAB — BASIC METABOLIC PANEL
CO2: 22 mEq/L (ref 19–32)
Calcium: 8.1 mg/dL — ABNORMAL LOW (ref 8.4–10.5)
Chloride: 110 mEq/L (ref 96–112)
Creatinine, Ser: 0.79 mg/dL (ref 0.4–1.5)
Glucose, Bld: 168 mg/dL — ABNORMAL HIGH (ref 70–99)

## 2010-03-26 LAB — UIFE/LIGHT CHAINS/TP QN, 24-HR UR
Free Kappa Lt Chains,Ur: 10 mg/dL — ABNORMAL HIGH (ref 0.04–1.51)
Free Lambda Lt Chains,Ur: 1.55 mg/dL — ABNORMAL HIGH (ref 0.08–1.01)
Total Protein, Urine: 13.1 mg/dL

## 2010-03-26 LAB — POCT CARDIAC MARKERS
CKMB, poc: <1 ng/mL — ABNORMAL LOW (ref 1.0–8.0)
Troponin i, poc: <0.05 ng/mL (ref 0.00–0.09)

## 2010-03-26 LAB — FERRITIN
Ferritin: 159 ng/mL (ref 22–322)
Ferritin: 169 ng/mL (ref 22–322)

## 2010-03-26 LAB — URINALYSIS, ROUTINE W REFLEX MICROSCOPIC
Glucose, UA: 500 mg/dL — AB
Nitrite: NEGATIVE
Specific Gravity, Urine: 1.02 (ref 1.005–1.030)
pH: 6.5 (ref 5.0–8.0)

## 2010-03-26 LAB — CBC
HCT: 35.9 % — ABNORMAL LOW (ref 39.0–52.0)
Hemoglobin: 12 g/dL — ABNORMAL LOW (ref 13.0–17.0)
MCV: 92.1 fL (ref 78.0–100.0)
Platelets: 119 10*3/uL — ABNORMAL LOW (ref 150–400)
RBC: 3.9 MIL/uL — ABNORMAL LOW (ref 4.22–5.81)
RDW: 14.3 % (ref 11.5–15.5)
WBC: 12.1 10*3/uL — ABNORMAL HIGH (ref 4.0–10.5)
WBC: 12.8 10*3/uL — ABNORMAL HIGH (ref 4.0–10.5)

## 2010-03-26 LAB — DIFFERENTIAL
Basophils Absolute: 0 10*3/uL (ref 0.0–0.1)
Basophils Relative: 0 % (ref 0–1)
Lymphocytes Relative: 4 % — ABNORMAL LOW (ref 12–46)
Monocytes Absolute: 0.7 10*3/uL (ref 0.1–1.0)
Neutro Abs: 11.5 10*3/uL — ABNORMAL HIGH (ref 1.7–7.7)

## 2010-03-26 LAB — PROTEIN ELECTROPHORESIS, SERUM
Albumin ELP: 55.7 % — ABNORMAL LOW (ref 55.8–66.1)
Alpha-1-Globulin: 7 % — ABNORMAL HIGH (ref 2.9–4.9)
Beta 2: 5.8 % (ref 3.2–6.5)
Total Protein ELP: 6 g/dL (ref 6.0–8.3)

## 2010-03-26 LAB — VITAMIN B12: Vitamin B-12: 152 pg/mL — ABNORMAL LOW (ref 211–911)

## 2010-03-26 LAB — FOLATE
Folate: 10.8 ng/mL
Folate: 9.5 ng/mL

## 2010-03-26 LAB — CULTURE, BLOOD (ROUTINE X 2)
Culture: NO GROWTH
Culture: NO GROWTH

## 2010-03-26 LAB — RETICULOCYTES
Retic Count, Absolute: 14 10*3/uL — ABNORMAL LOW (ref 19.0–186.0)
Retic Ct Pct: 0.4 % (ref 0.4–3.1)
Retic Ct Pct: 0.7 % (ref 0.4–3.1)

## 2010-04-21 LAB — CROSSMATCH: Antibody Screen: NEGATIVE

## 2010-04-21 LAB — ABO/RH: ABO/RH(D): O POS

## 2010-04-21 LAB — BASIC METABOLIC PANEL
BUN: 19 mg/dL (ref 6–23)
CO2: 22 mEq/L (ref 19–32)
Calcium: 7.8 mg/dL — ABNORMAL LOW (ref 8.4–10.5)
Chloride: 101 mEq/L (ref 96–112)
Creatinine, Ser: 1.26 mg/dL (ref 0.4–1.5)
GFR calc Af Amer: >60 mL/min (ref >60)
Glucose, Bld: 181 mg/dL — ABNORMAL HIGH (ref 70–99)

## 2010-04-21 LAB — URINALYSIS, ROUTINE W REFLEX MICROSCOPIC
Bilirubin Urine: NEGATIVE
Ketones, ur: NEGATIVE mg/dL
Nitrite: NEGATIVE
Protein, ur: 30 mg/dL — AB
Protein, ur: NEGATIVE mg/dL
Specific Gravity, Urine: 1.024 (ref 1.005–1.030)
Urobilinogen, UA: 0.2 mg/dL (ref 0.0–1.0)
Urobilinogen, UA: 0.2 mg/dL (ref 0.0–1.0)

## 2010-04-21 LAB — COMPREHENSIVE METABOLIC PANEL
BUN: 18 mg/dL (ref 6–23)
CO2: 28 mEq/L (ref 19–32)
Calcium: 9.7 mg/dL (ref 8.4–10.5)
Chloride: 101 mEq/L (ref 96–112)
Creatinine, Ser: 0.9 mg/dL (ref 0.4–1.5)
GFR calc Af Amer: >60 mL/min (ref >60)
GFR calc non Af Amer: >60 mL/min (ref >60)
Total Bilirubin: 0.5 mg/dL (ref 0.3–1.2)

## 2010-04-21 LAB — CBC
HCT: 41.1 % (ref 39.0–52.0)
Hemoglobin: 10.9 g/dL — ABNORMAL LOW (ref 13.0–17.0)
MCHC: 33.3 g/dL (ref 30.0–36.0)
MCHC: 33.5 g/dL (ref 30.0–36.0)
MCV: 90.4 fL (ref 78.0–100.0)
MCV: 90.6 fL (ref 78.0–100.0)
Platelets: 115 10*3/uL — ABNORMAL LOW (ref 150–400)
Platelets: 208 10*3/uL (ref 150–400)
RBC: 4.09 MIL/uL — ABNORMAL LOW (ref 4.22–5.81)
RBC: 4.54 MIL/uL (ref 4.22–5.81)
RDW: 14.2 % (ref 11.5–15.5)
RDW: 14.3 % (ref 11.5–15.5)
WBC: 17 10*3/uL — ABNORMAL HIGH (ref 4.0–10.5)
WBC: 7.2 10*3/uL (ref 4.0–10.5)

## 2010-04-21 LAB — GLUCOSE, CAPILLARY
Glucose-Capillary: 140 mg/dL — ABNORMAL HIGH (ref 70–99)
Glucose-Capillary: 163 mg/dL — ABNORMAL HIGH (ref 70–99)
Glucose-Capillary: 175 mg/dL — ABNORMAL HIGH (ref 70–99)
Glucose-Capillary: 215 mg/dL — ABNORMAL HIGH (ref 70–99)
Glucose-Capillary: 232 mg/dL — ABNORMAL HIGH (ref 70–99)
Glucose-Capillary: 249 mg/dL — ABNORMAL HIGH (ref 70–99)
Glucose-Capillary: 273 mg/dL — ABNORMAL HIGH (ref 70–99)

## 2010-04-21 LAB — URINE MICROSCOPIC-ADD ON

## 2010-04-21 LAB — APTT: aPTT: 28 seconds (ref 24–37)

## 2010-04-21 LAB — PROTIME-INR: INR: 0.9 (ref 0.00–1.49)

## 2010-05-05 ENCOUNTER — Other Ambulatory Visit: Payer: Self-pay | Admitting: *Deleted

## 2010-05-05 MED ORDER — SITAGLIPTIN PHOSPHATE 100 MG PO TABS
100.0000 mg | ORAL_TABLET | Freq: Every day | ORAL | Status: DC
Start: 2010-05-05 — End: 2011-05-11

## 2010-05-20 NOTE — Assessment & Plan Note (Signed)
OFFICE VISIT   Christian [name], Christian [name]  DOB:  05-07-1921                                       01/24/2008  CHART#:[id number]   The patient returns today 2 weeks post left carotid endarterectomy done  for severe left internal carotid stenosis with a previous history of a  left brain stroke which occurred in January of 2009.  This revealed in  right upper extremity clumsiness.  He has done very well since his left  carotid surgery with no new neurologic complications.  He is swallowing  well, has no hoarseness and has no new complaints.  He is taking Plavix  75 mg per day.   PHYSICAL EXAMINATION:  Blood pressure 136/62, heart rate 88,  respirations 14.  Left neck incision is well-healed.  Carotid pulse is  3+ with no bruit on the left, soft bruit on the right.  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   I have reassured him regarding these findings.  He will return in 6  months for followup carotid duplex exam.  He was seen by Dr. Larey Dresser as an outpatient for urinary retention and will be seeing him  again in 2 weeks but is voiding well now since his catheter was removed.   [name], M.D.  Electronically Signed   JDL/MEDQ  D:  01/24/2008  T:  01/25/2008  Job:  [id number]   cc:   Jeannett Senior A. Clent Ridges, MD  Maretta Bees. Vonita Moss, M.D.

## 2010-05-20 NOTE — Procedures (Signed)
EEG number [id number].   HISTORY:  This is an 77 year old patient with a presentation of facial  tingling and right hand numbness.  The patient is being evaluated for  the above episode.  This is a portable EEG recording.  No skull defects  noted.   MEDICATIONS:  Include Lovenox, aspirin, lisinopril, Glucophage,  glyburide, NovoLog, Reglan, and Senokot   EEG classification:  Normal awake.   DISCUSSION:  According to background rhythms, this recording system is a  very well modulated medium amplitude alpha rhythm of 9 Hz that is  reactive to eye opening and closure.  As the record progresses, the  patient appears to remain in the waking state throughout the recording.  Photic stimulation and hyperventilation were not performed.  At no time  during the record, did there appear to be evidence of spike, spike wave  discharges or evidence of focal slowing.  EKG monitor shows no evidence  of cardiac rhythm abnormalities with a heart rate of 72.   IMPRESSION:  This is a normal EEG recording in the awakened state.  No  evidence of ictal or interictal discharges were seen.      Marlan Palau, M.D.  Electronically Signed     EAV:WUJW  D:  01/28/2007 17:31:43  T:  01/29/2007 11:24:58  Job #:  [id number]

## 2010-05-20 NOTE — Discharge Summary (Signed)
Christian [name], Christian [name]                ACCOUNT NO.:  [id number]   MEDICAL RECORD NO.:  [id number]          [name] TYPE:  INP   LOCATION:  2040                         FACILITY:  [facility]   PHYSICIAN:  Quita Skye. Hart Rochester, M.D.  DATE OF BIRTH:  [birth year]   DATE OF ADMISSION:  01/13/2008  DATE OF DISCHARGE:  01/15/2008                               DISCHARGE SUMMARY   ADMISSION DIAGNOSES:  Severe left carotid occlusive disease with  previous left brain stroke.   FINAL DISCHARGE DIAGNOSES:  1. Severe left carotid occlusive disease with previous left brain      stroke status post left carotid endarterectomy.  2. Postoperative urinary retention requiring urology consult and was      sent home with Foley catheter.  3. Mild benign prostatic hypertrophy.  4. Traumatic false passage of Christian anterior urethra.  5. Diabetes mellitus type 2.  6. Hypertension.  7. History of aortic valve disease status post aortic valve      replacement.  8. Hyperlipidemia.  9. Degenerative joint disease of Christian shoulder.  10.Tonsillectomy.  11.History of cataract surgery.  12.Remote history of tobacco use.  13.Allergy to PENICILLIN.  14.Postoperative leukocytosis felt secondary to urinary tract      infection, discharged home on Cipro with plans to follow up with      Dr. Aldean Ast.   PROCEDURE:  January 13, 2008, left carotid endarterectomy with Dacron  patch angioplasty by Dr. Josephina Gip.   CONSULTANTS:  Urology, Dr. Vic Blackbird.   BRIEF HISTORY:  Christian [name] is an 77 year old Caucasian male who suffered  a left brain CVA in January 2009 consisting of right upper extremity  clumsiness and numbness as well as right perioral numbness.  Symptoms  persisted for a few months and then gradually resolved.  He has had no  further neurologic deficits since.  Workup included carotid duplex,  which showed 75% left internal carotid artery stenosis and he was  referred to Dr. Josephina Gip who recommended elective  left carotid  endarterectomy to reduce his risk for future stroke.  Of note, there was  approximately 50% right internal carotid artery stenosis.   HOSPITAL COURSE:  Christian [name] was electively admitted to [facility] on January 13, 2008.  He underwent Christian previously-mentioned  procedure.  We did have some postoperative hypertension and it was felt  that he would require even a nitroglycerin drip, but ultimately he was  sent to Christian intensive care unit in Christian meantime.  He was treated with  hydralazine and metoprolol, however.  He was neurologically intact  postoperatively.  However, he did have urinary retention postoperatively  and Christian nurses tried to pass a catheter, but were unsuccessful and did  get some blood per his urethra.  Ultimately, urology consult was  requested and apparently Dr. Aldean Ast tried a regular 16-French Foley  catheter with Xylocaine jelly, but was unable to pass Christian catheter.  He  tried with a 16 Coude, but was also unable to get that passed.  He then  did a flexible cystoscopy and was noted to have a mild false  passage  down Christian deep bulbous urethra that he was able to negotiate under direct  pressure.  Christian bladder was full, but free of any new mucosal lesions.  He was able to place a #16 Heymann catheter into Christian bladder and  received about 500 mL of output, some of which was irrigation.  We were  initially instructed to keep this till he became ambulatory; therefore,  we had him walk on postoperative day #1 and we discontinued Christian  catheter.  Unfortunately, Christian [name] was not able to void following  removal of Christian catheter.  Nursing staff again tried to insert a small  [place] catheter, but were unsuccessful.  They had to call Dr. Aldean Ast  back who was able to place a Foley using Xylocaine jelly.  He also felt  that we should not remove Christian Foley catheter instead send him home on  Flomax and had him be seen in approximately 1 week for a voiding  trial.  Of note, we also decided to start him on ciprofloxacin as his  preoperative urinalysis showed a large amount of leukocytes and his  postoperative labs showed an elevated white count up to 23,000.  We  checked this postoperative day #2 and it was down to 17,000.  We did  have another urine specimen sent and culture as well; however, Christian  culture is still pending.  His urinalysis showed a large amount of  leukocytes, no nitrates, a large amount of blood, 30 of protein, and  appearance was cloudy.  Otherwise, other results were within normal  limits.  Gram stain also showed 11-20 WBCs.  Other than his urinary  retention and since his blood pressure had stabilized, he was felt  appropriate for discharge once Foley catheter was inserted.  As of  January 10, his vitals have been stable, last blood pressure 117/59, he  has been afebrile, heart rate in Christian 90s to low 110s in a regular  rhythm, oxygen saturation 93% on room air.  Labs showed a decreasing  white count of 17,000, hemoglobin of 10.9, hematocrit of 32.9, platelet  count of 115, sodium of 132, potassium of 3.5, glucose of 181, BUN of  19, and creatinine of 1.26.  Urinalysis results as discussed above and  again his urine culture is still pending.  He has been neurologically  intact.  He has been able to eat without difficulty and pain is  controlled.  His incision is healing well without signs of infection.   DISPOSITION:  Christian [name] was felt appropriate for discharge home on  postoperative day #2, January 15, 2008.  He will be discharged home with  a Foley catheter and overnight drainage bag and leg bag with  instructions.   DISCHARGE MEDICATIONS:  1. Lisinopril 20 mg q.a.m.  2. Metformin 1000 mg b.i.d.  3. Glyburide 5 mg b.i.d.  4. Plavix 75 mg daily.  5. Januvia 100 mg daily.  6. Ocuvite Lutein every a.m.  7. Tums as needed.  8. Cipro 500 mg one p.o. b.i.d. x7 days.  9. Percocet 5/325 mg 1 tablet p.o. q.4 h. p.r.n.  pain.  10.Flomax 0.4 mg one p.o. daily   DISCHARGE INSTRUCTIONS:  He is to continue a diabetic appropriate diet.  May shower and clean his incisions gently with soap and water.  Call if  he has fever greater than 101, redness or drainage from his incision  sites or new motor speech or visual changes.  He should avoid driving or  heavy  lifting for Christian next 2 weeks.  He will see Dr.  Hart Rochester in approximately 2 weeks.  Our office will call and arrange this  followup.  He is also instructed to call Dr. Valda Lamb office at [phone] to schedule 1-week followup for a voiding trial.  He was also given  a note per Dr. Aldean Ast to instruct them to follow up on his urine  culture results.      Jerold Coombe, P.A.      [name], M.D.  Electronically Signed    AWZ/MEDQ  D:  01/15/2008  T:  01/15/2008  Job:  [id number]   cc:   Quita Skye. Hart Rochester, M.D.  Courtney Paris, M.D.  Peter M. [place]il[place], M.D.  Tera Mater. Clent Ridges, MD

## 2010-05-20 NOTE — Consult Note (Signed)
NAME:  Christian [name], Christian [name]                ACCOUNT NO.:  [id number]   MEDICAL RECORD NO.:  [id number]          PATIENT TYPE:  EMS   LOCATION:  MAJO                         FACILITY:  [facility]   PHYSICIAN:  Deanna Artis. Hickling, M.D.DATE OF BIRTH:  [birth year]   DATE OF CONSULTATION:  01/27/2007  DATE OF DISCHARGE:                                 CONSULTATION   CHIEF COMPLAINT:  Right face and arm numbness.   HISTORY OF PRESENT ILLNESS:  An 77 year old married right-handed  Caucasian male awakened this morning feeling well.  Around 8:30 he had  the onset of right-sided facial paresthesias and numbness in the right  hand and arm.  This was also paresthetic.  Symptoms have been  persistent.  The patient finally told his wife and they arrived by car  10:46 a.m.  Code stroke called 10:49 a.m. CT 11:00 a.m. in interpreted  by me 11:03 a.m.  This shows an ill-defined left parietal white matter  lesion without clear mass effect.  This is possible left remote stroke  or primary or secondary tumor.  The patient also may have a small right  infarction in the cerebellar white matter.   PAST MEDICAL HISTORY:  Positive for  1. Diabetes mellitus.  2. Hypertension.   PAST SURGICAL HISTORY:  1. Bilateral iridectomy.  2. In 2000, the patient had a bovine aortic valve replacement.   MEDICATIONS:  1. Glyburide 5 mg twice daily.  2. Metformin 1000 mg twice daily.  3. Lisinopril 20 mg daily.  4. Aspirin 81 mg daily.   DRUG ALLERGIES:  PENICILLIN.   REVIEW OF SYSTEMS:  The patient is healthy except as noted above.   FAMILY HISTORY:  Negative for stroke.  Positive for hypertension and  heart disease.   SOCIAL HISTORY:  The patient quit smoking 50 years ago.  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  No alcohol or drugs.  He is  married.  He is a [employer] Cyt[employer]ist of [employer].   PHYSICAL EXAMINATION:  VITAL SIGNS:  Pulse 85, respirations 20, pulse  oximetry 95%, temperature  97, blood pressure 203/76.  HEENT:  No bruits.  LUNGS:  Clear.  HEART:  No murmurs.  Pulses normal.  ABDOMEN:  Soft.  Bowel sounds normal.  No hepatosplenomegaly.  EXTREMITIES:  Unremarkable.  NEUROLOGIC:  The patient is alert, no dysphasia or dyspraxia.  Cranial  nerves:  Round reactive pupils, status post iridectomy.  Fundi were  normal.  Visual fields full to double simultaneous stimuli.  Symmetric  facial strength.  Diminished auditory acuity.  Midline tongue.  Motor  examination:  Normal strength.  No drift.  Fine motor movements normal.  Sensory examination:  Subjective numbness of the right face, otherwise  negative, normal stereoagnosis bilaterally.  Cerebellar examination:  Finger-to-nose and rapid repetitive movements and heel-knee-shin were  normal.  Gait was not tested.  Deep tendon reflexes were diminished.  The patient had bilateral flexor plantar responses.   IMPRESSION:  Ill-defined lesion left parietal region.  If this appears  to be a remote stroke on MRI  scan, we will admit to stroke service.  If,  however, the patient has a primary or secondary brain tumor, we will ask  [facility] to admit with a neurosurgery consult.  MRI scan  without and with contrast to be done now, EEG this afternoon.   LABORATORY STUDIES:  White blood cell count 6500, hemoglobin 14,  hematocrit 40.9, MCV 87.8, platelet count 179,0000; 60 polys, 31 lymphs,  9 monos, 1 eosinophil (total is 101) 3900 neutrophils.  PT is 12.3, INR  0.9, PTT 27.  Comprehensive metabolic panel with sodium 136, potassium  4.1, chloride 103, CO2 25, BUN 21, creatinine 1.04, GFR is greater than  60, total protein 6.6, albumin 3.8, calcium 9.8, SGOT 24, SGPT 21, total  bilirubin 0.4, alkaline phosphatase 78.  Cardiac enzymes with CK 65, CK-  MB 1.6, troponin 0.02.   EKG shows a normal sinus rhythm, left axis deviation, right bundle  branch block, ST-T wave changes present since October 02, 1998.   Inferolateral ischemia cannot be ruled out, however, from a chemical  point of view and from a symptomatic point of view, the patient does not  have substernal chest pain.  He has some aching in the right shoulder.  This does not appear to be cardiac ischemia.      Deanna Artis. Sharene Skeans, M.D.  Electronically Signed     WHH/MEDQ  D:  01/27/2007  T:  01/27/2007  Job:  [id number]   cc:   Jeannett Senior A. Clent Ridges, MD

## 2010-05-20 NOTE — H&P (Signed)
HISTORY AND PHYSICAL EXAMINATION   December 20, 2007   Re:  Christian [name], Seymour                  DOB:  [birth year]   CHIEF COMPLAINT:  Moderately severe left internal carotid stenosis  status post left brain CVA.   HISTORY OF PRESENT ILLNESS:  This 77 year old patient suffered a left  brain CVA in January of 2009 consisting of right upper extremity  clumsiness and numbness as well as right perioral numbness.  These  symptoms persisted for a few months and then gradually resolved.  He has  had no further neurologic deficits or symptoms since that time including  hemiparesis, aphasia, amaurosis fugax, diplopia, blurred vision or  syncope.  He was found to have 75% left internal carotid stenosis and is  now scheduled for an elective left carotid endarterectomy for prevention  of further stroke.   PAST MEDICAL HISTORY:  1. Non-insulin-dependent diabetes mellitus.  2. Hypertension.  3. History of aortic valve disease status post aortic valve      replacement.  4. Hyperlipidemia.  5. Degenerative joint disease in the shoulder.  6. Negative for coronary artery disease, COPD.   PAST SURGERIES:  1. Aortic valve replacement.  2. Tonsillectomy.  3. Cataract surgery.   FAMILY HISTORY:  Positive for stroke in his father, myocardial  infarction in two brothers and negative for diabetes.   SOCIAL HISTORY:  Married, has four children.  He is a retired Hydrologist.  Has not smoked cigarettes for 20 years and does not use  alcohol.   REVIEW OF SYSTEMS:  Negative for chest pain, dyspnea on exertion, PND,  orthopnea.  Has a heart murmur.  No productive cough, bronchitis,  asthma, wheezing, GI or GU symptoms.  Ambulates long distances without  discomfort.   ALLERGIES:  Penicillin.   MEDICATIONS:  1. Lisinopril 20 mg one a day.  2. Metformin 1000 mg two a day.  3. Glyburide 5 mg two a day.  4. Plavix 75 mg one a day.  5. Januvia 100 mg one a day.  6. Ocuvite lutein for  eyes one a day.   PHYSICAL EXAM:  Vital signs:  Blood pressure 100/67, heart rate is 89,  respirations 18.  General:  He is a healthy-appearing elderly male in no  apparent distress, alert and oriented x3.  Neck:  Supple, 3+ carotid  pulses.  There is soft bruit on the left.  Neurologic:  Is grossly  normal today.  No palpable adenopathy in the neck.  Chest:  Clear to  auscultation.  Cardiovascular:  Regular rhythm.  No murmurs.  Median  sternotomy wound is well-healed.  Abdomen:  Soft, nontender with no  masses.  He has 3+ femoral and popliteal pulses bilaterally with well-  perfused lower extremities.   Carotid duplex exam performed at [facility] on 12/20/2007 revealed 75% left  internal carotid stenosis, approximate 50% right internal carotid  stenosis.   IMPRESSION:  1. Moderately severe left internal carotid stenosis status post left      brain cerebrovascular accident now totally recovered.  2. History of aortic valve disease status post aortic valve      replacement now asymptomatic.  3. Non-insulin-dependent diabetes mellitus.  4. Hypertension.   PLAN:  To admit the patient on January 7 for an elective left carotid  endarterectomy.  Risks and benefits have been thoroughly discussed with  the patient and his wife and he would like to proceed.   Christian [name]  [name], M.D.  Electronically Signed   JDL/MEDQ  D:  12/20/2007  T:  12/21/2007  Job:  [id number]   cc:   Jeannett Senior A. Clent Ridges, MD  Peter M. [place], M.D.

## 2010-05-20 NOTE — Cardiovascular Report (Signed)
NAMEADONYS, WILDES                ACCOUNT NO.:  [id number]   MEDICAL RECORD NO.:  [id number]          PATIENT TYPE:  INP   LOCATION:  5501                         FACILITY:  [facility]   PHYSICIAN:  Larina Earthly, M.D.    DATE OF BIRTH:  August 03, 1921   DATE OF PROCEDURE:  01/28/2007  DATE OF DISCHARGE:                            CARDIAC CATHETERIZATION   REASON FOR CONSULTATION:  Left internal carotid stenosis.   HISTORY OF PRESENT ILLNESS:  Christian [name] is an 77 year old right-handed  white male who was admitted to [facility] on 01/22 with right  facial numbness and right hand numbness.  He denies any prior neurologic  deficits.  He was seen in the emergency department and the CAT scan  showed an evidence of an old left brain stroke, but no bleed.  He was  able to walk and had slow improvement of the paresthesias.   PAST MEDICAL HISTORY:  Significant for hypertension, diabetes.   PAST SURGICAL HISTORY:  He is status post coronary artery bypass  grafting approximately 5 years ago.  He had aortic valve replacement at  the same time, that was a bovine graft.   SOCIAL HISTORY:  He is retired.  He is married.  He is a former smoker.  Denies alcohol consumption now.   ALLERGIES:  PENICILLIN.   FAMILY HISTORY:  His father had a stroke at age 61.   PHYSICAL EXAMINATION:  GENERAL:  Alert and oriented, well-developed  white male in no acute distress sitting in a hospital bed.  NEUROLOGIC:  He is grossly intact neurologically.  He has equal grip  strength.  VASCULAR:  His radial pulses are 2+.  His carotid pulses are normal.  HEENT:  He does not have any swallowing difficulties.   X-RAYS:  He did have an MRI showing a left acute thalamic infarct and an  old right parietal infarct.  Carotid duplex revealed 60 to 80% left  internal carotid artery stenosis and no significant right carotid  stenosis.   IMPRESSION:  1. New left thalamic infarct with improvement.  2. Old silent left  parietal infarct.   PLAN:  Discussed this at length with Mr. Soth and reviewed this and  reviewed his chart as well.  I would concern with Dr. Pearlean Brownie and the  stroke team that this is an asymptomatic carotid disease.  He is  certainly at the lower end of the threshold for recommendations for  this.  The only concern is his prior silent left parietal infarct.  His  plan is for  discharge tomorrow on 04/54 and I certainly agree on this.  He will  continue his follow-up with the stroke service and we would be available  for left endarterectomy if he had progression of disease or concern for  symptomatic disease.  We will then defer this to stroke service.      Larina Earthly, M.D.  Electronically Signed     TFE/MEDQ  D:  01/28/2007  T:  01/29/2007  Job:  [id number]   cc:   Vikki Ports A. Felicity Coyer, MD  Pramod P. Pearlean Brownie, MD

## 2010-05-20 NOTE — Procedures (Signed)
CAROTID DUPLEX EXAM   INDICATION:  Followup known carotid artery disease and left carotid  endarterectomy.   HISTORY:  Diabetes:  Yes.  Cardiac:  Valve replacement.  Hypertension:  Yes.  Smoking:  Quit.  Previous Surgery:  Left carotid endarterectomy.  CV History:  No.  Amaurosis Fugax No, Paresthesias No, Hemiparesis No                                       RIGHT             LEFT  Brachial systolic pressure:         120               128  Brachial Doppler waveforms:         Biphasic          Biphasic  Vertebral direction of flow:        Antegrade         Antegrade  DUPLEX VELOCITIES (cm/sec)  CCA peak systolic                   93                117  ECA peak systolic                   133               426  ICA peak systolic                   145               98  ICA end diastolic                   29                22  PLAQUE MORPHOLOGY:                  Heterogeneous     Heterogeneous  PLAQUE AMOUNT:                      Moderate          Moderate  PLAQUE LOCATION:                    ICA and ECA       ECA   IMPRESSION:  1. 40-59% stenosis noted in the right ICA.  Normal carotid duplex      noted in the left ICA.  2. Status post left carotid endarterectomy.  3. Antegrade bilateral vertebral arteries.   ___________________________________________  [name], M.D.   MG/MEDQ  D:  07/24/2008  T:  07/25/2008  Job:  [id number]

## 2010-05-20 NOTE — Consult Note (Signed)
Christian [name], Christian [name]                ACCOUNT NO.:  [id number]   MEDICAL RECORD NO.:  [id number]          PATIENT TYPE:  INP   LOCATION:  2314                         FACILITY:  [facility]   PHYSICIAN:  Courtney Paris, M.D.DATE OF BIRTH:  [birth year]   DATE OF CONSULTATION:  DATE OF DISCHARGE:                                 CONSULTATION   REASON FOR CONSULTATION:  Placement of Foley catheter, requested by Dr.  Jerilee Field.   REQUESTING PHYSICIAN:  Quita Skye. Hart Rochester, MD   BRIEF HISTORY:  This 77 year old patient underwent a left carotid  endarterectomy earlier this evening, was unable to void, and was having  difficulty.  The nurses tried to pass a catheter were not successful and  did get some blood per urethra.  He suffered a left brain CVA in January  2009, consisting of right upper extremity weakness.  These symptoms  persisted a few months and then gradually resolved.  He was found to  have a 75% left internal carotid stenosis and had an elective left  carotid endarterectomy for a prevention of further stroke.   OTHER MEDICAL PROBLEMS:  1. Non-insulin-dependent type 2 diabetes mellitus.  2. Hypertension.  3. History of aortic valve disease status post replacement.  4. Hyperlipidemia.  5. Degenerative joint disease in the shoulder.   Other than cataract surgery, tonsillectomy, and the aortic valve  replacement, he has had no other surgery.   Family history is positive for stroke, myocardial infarction.   He is married and has 4 children.  He is retired Copy.  He has  not smoked cigarettes for more than 20 years.  He does not use alcohol.   Review of systems is obtained from the chart, really unremarkable except  for HPI.   He has allergies to PENICILLIN.   He denies previous GE problems.  He was having a little bit of  difficulty voiding prior to surgery.   His medicines on admission were lisinopril, metformin, glyburide,  Plavix, Januvia, and Ocuvite.   PHYSICAL EXAMINATION:  GENERAL:  He is a pleasant pale white male, lying  quietly in bed, but complaining of some pain in his abdomen.  VITAL SIGNS:  His blood pressure is elevated at 189/69, pulse is 100,  respirations 18.  NECK:  He has a bandage on his left neck from recent surgery.  HEENT:  Otherwise negative.  CHEST:  Clear.  ABDOMEN:  Soft but distended and a little tender in the suprapubic area.  HEART:  History of aortic valve noted.  GENITOURINARY:  Penis is normal, circumcised.  There is some blood at  the meatus.  Bilaterally descended testes are noted without tenderness.  Prostate is moderately enlarged on rectal exam.  EXTREMITIES:  Negative.  No edema.  Good distal pulses.   I tried to pass just a regular 16 Foley catheter with Xylocaine Jelly,  was unable to.  I then tried a 16 coude, and I was unable to get that.  It seemed to hang up down in deep bulbous urethra.  Then, we prepped and  draped the patient and did  flexible cystoscopy.  He had a mild false  passage down the deep bulbous urethra that was able negotiate under  direct vision.  He had a bilobar enlarged prostate, and the bladder was  entered and the bladder was full but free of any mucosal lesions.  I  passed a guidewire through the open end of the cystoscope and removed  the cystoscope itself.  Over the guidewire, I was able to pass a #16  Heymann catheter into the bladder, removed the guidewire, and filled up  the balloon with 10 mL of sterile water.  The catheter was left to  straight drainage.  He had over 500 mL, some of this was irrigation, and  his urine was just light pink in color.   IMPRESSION:  1. Urinary retention, postop.  2. Mild benign prostatic hypertrophy.  3. Recent left carotid endarterectomy.  4. Traumatic false passage of the anterior urethra.   RECOMMENDATIONS:  Just leave the Foley catheter until he is fully  ambulatory and then take it out.  You might put him on some Flomax a few   days before the voiding trial.  Give [place] a call if there is any problems  or difficulties voiding afterwards.      Courtney Paris, M.D.  Electronically Signed     HMK/MEDQ  D:  01/13/2008  T:  01/14/2008  Job:  [id number]

## 2010-05-20 NOTE — Procedures (Signed)
CAROTID DUPLEX EXAM   INDICATION:  Followup carotid artery disease.   HISTORY:  Diabetes:  Yes.  Cardiac:  Valve replacement.  Hypertension:  Yes.  Smoking:  Previous.  Previous Surgery:  Left carotid endarterectomy 01/13/2008.  CV History:  Currently asymptomatic.  Amaurosis Fugax No, Paresthesias No, Hemiparesis No                                       RIGHT             LEFT  Brachial systolic pressure:         144               140  Brachial Doppler waveforms:         WNL               WNL  Vertebral direction of flow:        Antegrade         Antegrade  DUPLEX VELOCITIES (cm/sec)  CCA peak systolic                   63                68  ECA peak systolic                   146               142  ICA peak systolic                   140               118  ICA end diastolic                   18                18  PLAQUE MORPHOLOGY:                  Heterogeneous     Heterogeneous  PLAQUE AMOUNT:                      Moderate          Moderate  PLAQUE LOCATION:                    ICA, ECA, CCA     ECA, CCA   IMPRESSION:  Bilateral 40% to 59% stenosis within the internal carotid  artery.  However, this is most likely due to vessel tortuosity and  changes in vessel diameter.  Bilateral intimal thickening within the  common carotid arteries.  Patent left carotid endarterectomy.  Study  stable compared to previous.     ___________________________________________  [name], M.D.   OD/MEDQ  D:  01/17/2010  T:  01/17/2010  Job:  [id number]

## 2010-05-20 NOTE — Assessment & Plan Note (Signed)
OFFICE VISIT   Christian [name], Christian [name]  DOB:  Nov 15, 1921                                       07/24/2008  CHART#:[id number]   The patient return today 6 months post left carotid endarterectomy for  severe left internal carotid stenosis having previously suffered a left  brain stroke 1 year earlier in January of 2009.  This resulted in some  right upper extremity clumsiness and right periorbital weakness and  numbness which completely resolved.  He has done well since his surgery  with no neurologic complications or specific complaints.  He denies any  hemispheric or nonhemispheric TIAs, amaurosis fugax, diplopia, blurred  vision or syncope.  He does state that he develops a burning discomfort  in his lower back extending around both into both thighs with walking.  He has no rest pain or history of nonhealing ulcers.   PHYSICAL EXAMINATION:  On physical examination today blood pressure is  140/56, heart rate 84, respirations 14.  His carotid pulses are 3+, soft  bruit on the left, no bruit on the right.  Neurologic exam is normal.  No palpable adenopathy in the neck.  Chest clear to auscultation.  Abdomen soft, nontender, with no masses.  He has 3+ femoral pulses  bilaterally with no distal pulses palpable.  Both feet are well-  perfused.   I think he is doing well from his carotid surgery and has no evidence of  restenosis in the left internal carotid on duplex and has a 40-50% right  internal carotid stenosis which is stable.  We will see him in 6 months  for followup carotid study unless he develops any symptoms in the  interim.  If his symptoms in his back and buttock become more severe he  may need an orthopedic consultation to rule out spinal stenosis.   [name], M.D.  Electronically Signed   JDL/MEDQ  D:  07/24/2008  T:  07/25/2008  Job:  [id number]

## 2010-05-20 NOTE — Procedures (Signed)
CAROTID DUPLEX EXAM   INDICATION:  Carotid stenosis.   HISTORY:  Diabetes:  Yes.  Cardiac:  Valve replacement.  Hypertension:  Yes.  Smoking:  Previous.  Previous Surgery:  No.  CV History:  History of stroke in January of 2009, per the patient.  Amaurosis Fugax No, Paresthesias No, Hemiparesis No.                                       RIGHT             LEFT  Brachial systolic pressure:         152               150  Brachial Doppler waveforms:         Normal            Normal  Vertebral direction of flow:        Antegrade  Antegrade/resistive  DUPLEX VELOCITIES (cm/sec)  CCA peak systolic                   161               144  ECA peak systolic                   241               190  ICA peak systolic                   158               295  ICA end diastolic                   21                40  PLAQUE MORPHOLOGY:                  Mixed/calcific    Mixed/calcific  PLAQUE AMOUNT:                      Mild/moderate     Moderate/severe  PLAQUE LOCATION:                    ICA/ECA/CCA       ICA/ECA/CCA   IMPRESSION:  1. Doppler velocities suggest 40-59% stenosis of the right internal      carotid artery and a high-end 60-79% stenosis of the left internal      carotid artery.  2. Elevated velocities are noted throughout the bilateral carotid      systems which may be due to increased cardiac output.  3. The left vertebral artery flow demonstrates a resistive waveform      which may suggest a distal stenosis.       ___________________________________________  [name], M.D.   CH/MEDQ  D:  12/20/2007  T:  12/20/2007  Job:  [id number]

## 2010-05-20 NOTE — Procedures (Signed)
CAROTID DUPLEX EXAM   INDICATION:  Left carotid endarterectomy.   HISTORY:  Diabetes:  Yes.  Cardiac:  Valve replacement.  Hypertension:  Yes.  Smoking:  Previous.  Previous Surgery:  Left carotid endarterectomy on 01/13/2008.  CV History:  Currently asymptomatic.  Amaurosis Fugax No, Paresthesias No, Hemiparesis No                                       RIGHT             LEFT  Brachial systolic pressure:         148               136  Brachial Doppler waveforms:         Normal            Normal  Vertebral direction of flow:        Antegrade         Antegrade  DUPLEX VELOCITIES (cm/sec)  CCA peak systolic                   70                100  ECA peak systolic                   151               231  ICA peak systolic                   145               133  ICA end diastolic                   22                19  PLAQUE MORPHOLOGY:                  Mixed             Heterogeneous  PLAQUE AMOUNT:                      Moderate          Mild  PLAQUE LOCATION:                    ICA / ECA / CCA   ECA / CCA   IMPRESSION:  1. 40%-59% stenosis of the right internal carotid artery.  2. Doppler velocity suggests a 40%-59% stenosis of the left internal      carotid artery, however, this is most likely due to mild intimal      hyperplasia and a change in vessel diameter.  3. Mild increase in the left internal carotid artery velocities when      compared to the previous exam on 07/24/2008 with no significant      change in the right internal carotid artery.   ___________________________________________  [name], M.D.   CH/MEDQ  D:  01/23/2009  T:  01/23/2009  Job:  [id number]

## 2010-05-20 NOTE — Discharge Summary (Signed)
[name], [name] NO.:  [id number]   MEDICAL RECORD NO.:  [id number]          PATIENT TYPE:  INP   LOCATION:  5501                         FACILITY:  [facility]   PHYSICIAN:  Corwin Levins, MD      DATE OF BIRTH:  [birth year]   DATE OF ADMISSION:  01/27/2007  DATE OF DISCHARGE:  01/29/2007                               DISCHARGE SUMMARY   DISCHARGE DIAGNOSES:  1. Acute CVA (cerebrovascular accident) specifically left thalamic      infarct considered most likely thrombotic.  2. Residual right facial weakness and distal right upper extremity      weakness and numbness persistent.  3. BPH (benign prostatic hyperplasia).  4. Severe aortic stenosis status post aortic valve replacement.  5. Hypertension.  6. Diabetes mellitus.  7. Right shoulder degenerative joint disease.  8. Mitral valve insufficiency.  9. Right bundle branch block and left anterior fascicular block.  10.History of shingles.   PROCEDURES:  1. Head MRI consistent with above.  2. EEG normal.  3. Doppler ultrasound showing 60-80% LICA stenosis.  4. OT consult.  No OT needs post discharge identified.  5. Speech pathology consult, negative exam for significant      abnormalities.   CONSULTATIONS:  1. Dr. Pearlean Brownie, neurology.  2. DVS.  3. 2-D echocardiogram.   HISTORY AND PHYSICAL:  See that dictated the day of admission January 27, 2007.   HOSPITAL COURSE:  Christian [name] is a very nice 77 year old married white  male, retired, former smoker with co-morbidities as above who was  admitted as above with right facial and right upper extremity symptoms  with a working diagnosis of stroke versus mass.  MRI was consistent with  left thalamic infarct, acute.  Neurology consultation obtained and  workup performed as above.  The patient's neurological symptoms remained  stable throughout his hospitalization.  His initial aspirin 81 mg was  increased to 325 mg then changed to Plavix after complete evaluation  and  evaluation consistent with probable thrombotic event and not embolic.  DVS was consulted for the identified LICA 60-80% stenoses.  This was  felt not to be related to the current event and therefore only a soft  indication for immediate management.  Medications were adjusted as above  and the recommendation was made to consider a followup Doppler  ultrasound in 6 months and reconsultation at that time for elective left  CEA.   As the patient was ambulatory, eating well, no further neurological  symptoms, no other needs identified it was felt he had gained maximum  benefit from this hospitalization and is to be discharged home.  It  should be noted that homocystine level was 14.5 which is within normal  limits at the time of discharge.  Lipid profile did show an LDL of 92  and no further adjustments on cholesterol therapy were made at the time  of discharge.  Also of note is the patient had normal white blood cells,  hemoglobin, electrolytes, BUN, creatinine, LFTs as well as CPK-MB at  time of admission and felt otherwise stable.  DISPOSITION:  Discharged to home in good condition.  There are no  activity or dietary restrictions.  He is to follow up with Dr. Clent Ridges, his  primary care MD, within 1-2 weeks.  Consideration should be made to  follow up with repeat Doppler ultrasound in 6 months for consideration  for elective left CEA at that time.   DISCHARGE MEDICATIONS:  1. To include Plavix 75 mg one p.o. daily.  2. Lisinopril 20 mg p.o. daily.  3. Metformin 1000 mg p.o. b.i.d.  4. Glyburide 5 mg p.o. b.i.d.   It should be noted hemoglobin A1c was ordered but not returned at the  time of discharge.  CBG through his hospitalization remained stable.      Corwin Levins, MD  Electronically Signed     JWJ/MEDQ  D:  01/29/2007  T:  01/29/2007  Job:  [id number]   cc:   Tera Mater. Clent Ridges, MD

## 2010-05-20 NOTE — Op Note (Signed)
Christian [name], Christian [name]                ACCOUNT NO.:  [id number]   MEDICAL RECORD NO.:  [id number]          PATIENT TYPE:  INP   LOCATION:  2314                         FACILITY:  [facility]   PHYSICIAN:  Quita Skye. Hart [place], M.D.  DATE OF BIRTH:  10-04-1921   DATE OF PROCEDURE:  01/13/2008  DATE OF DISCHARGE:                               OPERATIVE REPORT   PREOPERATIVE DIAGNOSIS:  Severe left carotid occlusive disease with  previous left brain stroke.   POSTOPERATIVE DIAGNOSIS:  Severe left carotid occlusive disease with  previous left brain stroke.   OPERATION:  Left carotid endarterectomy with Dacron patch angioplasty.   SURGEON:  Quita Skye. Hart [place], MD   ASSISTANT:  Wilmon Arms, PA.   ANESTHESIA:  General endotracheal.   BRIEF HISTORY:  This patient suffered a left brain stroke in January  2009 consisting of right upper extremity clumsiness, some right  periorbital weakness and numbness.  That completely resolved and he has  been found to have an approximately 75-80% left internal carotid  stenosis, now scheduled for left carotid endarterectomy.   PROCEDURE:  The patient was taken to the operating room and placed in  the supine position at which time satisfactory general endotracheal  anesthesia was administered.  The left neck was prepped with Betadine  scrub and solution and draped in the routine sterile manner.  Incision  was made along the anterior border of the sternocleidomastoid muscle and  carried down through the subcutaneous tissue and platysma using Bovie.  Common facial vein and external jugular veins were ligated with 3-0 silk  ties and divided exposing the common internal and external carotid  arteries.  Care was taken not to injure the vagus or hypoglossal nerves,  both of which were exposed.  There was a calcified atherosclerotic  plaque at the carotid bifurcation extending up the internal carotid  artery about 3-4 cm, distal vessel appeared normal.  A #10 shunt was  prepared and the patient was heparinized.  The carotid vessels were  occluded with vascular clamps.  A longitudinal opening was made in the  common carotid with a 15 blade and extended up to the internal carotid  with Potts scissors to a point distal to the disease.  The plaque was  approximately 70-80% stenotic in severity.  Distal vessel appeared  normal.  A #10 shunt was inserted without difficulty reestablishing flow  in about 2 minutes.  Standard endarterectomy was then performed using  elevator and Potts scissors with eversion endarterectomy of the external  carotid.  The plaque feathered off distal internal carotid artery nicely  not requiring any tacking sutures.  The lumen was thoroughly irrigated  with heparin saline.  All loose debris were carefully removed and  arteriotomy was closed with a patch using continuous 6-0 Prolene.  Prior  to completion of the closure, the shunt was removed after about 30  minutes of shunt time.  Following antegrade and retrograde flushing,  closure was completed, reestablishing flow initially up the external and  up to the internal  branch.  Carotid was occluded for less than 2 minutes for  removal of the  shunt.  Protamine was then given to reverse the heparin.  Following  adequate hemostasis, wound was irrigated with saline, closed in layers  with Vicryl in the subcuticular fashion.  Sterile dressing was applied.  The patient was taken to the recovery in satisfactory condition.      [name], M.D.  Electronically Signed     JDL/MEDQ  D:  01/13/2008  T:  01/14/2008  Job:  [id number]

## 2010-05-23 ENCOUNTER — Other Ambulatory Visit: Payer: Self-pay | Admitting: Family Medicine

## 2010-05-23 DIAGNOSIS — I6529 Occlusion and stenosis of unspecified carotid artery: Secondary | ICD-10-CM

## 2010-05-23 NOTE — Assessment & Plan Note (Signed)
[facility]                              [facility] OFFICE NOTE   NAME:Bianca, Makyi                         MRN:          [id number]  DATE:11/02/2005                            DOB:          Mar 11, 1921    This is an 77 year old gentleman here for a complete exam.  In general, he  is doing well and has no complaints.  Of note, he had bilateral cataract  surgery this past summer by Dr. Dione Booze, and apparently had an excellent  result.  He admits to not getting much exercise during the hot summer  months, but does watch his diet.  For further details of his past medical  history, family history, social history ________ for the last physical note  dated October 29, 2004.   ALLERGIES:  PENICILLIN.   CURRENT MEDICATIONS:  1. Metformin 500 mg b.i.d.  2. Glyburide 5 mg b.i.d.  3. Aspirin 81 mg per day.  4. Lisinopril 2 mg per day.  5. A multivitamin daily.   OBJECTIVE:  VITAL SIGNS:  Height 5 feet, 11 inches.  Weight 171, blood  pressure 138/64, pulse 76 and regular.  GENERAL:  Appears to be quite healthy.  SKIN:  Clear.  HEENT:  Eyes clear.  Sclerae and oropharynx is clear.  NECK:  Supple without lymphadenopathy or masses.  LUNGS:  Clear.  CARDIAC:  Rate and rhythm are regular without gallops, murmurs or rubs.  Distal pulses are full.  EKG shows right bundle branch block with left  anterior fascicular block and a sinus rhythm.  This represents no change  from his baseline.  ABDOMEN:  Soft, normal bowel sounds, nontender, no masses.  GENITALIA:  Normal male.  RECTAL:  No masses or tenderness.  Stool Hemoccult negative.  Prostate is  within normal limits.  EXTREMITIES:  No clubbing, cyanosis, or edema.  NEUROLOGIC:  Grossly intact.   ASSESSMENT AND PLAN:  1. Complete physical.  We talked about getting more regular exercise.  2. Hypertension, stable.  3. Type 2 diabetes mellitus.  Will check laboratory work today including      fasting lipids.  4. He was given a flu shot today.   ______________________________  Tera Mater. Clent Ridges, MD   SAF/MedQ  DD: 11/02/2005  DT: 11/03/2005  Job #: [id number]

## 2010-05-27 ENCOUNTER — Other Ambulatory Visit: Payer: Self-pay | Admitting: *Deleted

## 2010-05-27 ENCOUNTER — Encounter (INDEPENDENT_AMBULATORY_CARE_PROVIDER_SITE_OTHER): Payer: Medicare Other | Admitting: *Deleted

## 2010-05-27 DIAGNOSIS — I6529 Occlusion and stenosis of unspecified carotid artery: Secondary | ICD-10-CM

## 2010-05-28 ENCOUNTER — Encounter: Payer: Self-pay | Admitting: Family Medicine

## 2010-06-03 ENCOUNTER — Other Ambulatory Visit: Payer: Self-pay | Admitting: *Deleted

## 2010-06-03 MED ORDER — METFORMIN HCL 1000 MG PO TABS: 1000.0000 mg | ORAL_TABLET | Freq: Two times a day (BID) | ORAL | Status: DC

## 2010-06-03 MED ORDER — GLYBURIDE 5 MG PO TABS: 5.0000 mg | ORAL_TABLET | Freq: Two times a day (BID) | ORAL | Status: DC

## 2010-08-06 ENCOUNTER — Other Ambulatory Visit: Payer: Self-pay | Admitting: Family Medicine

## 2010-08-11 ENCOUNTER — Other Ambulatory Visit: Payer: Self-pay | Admitting: Cardiology

## 2010-08-11 NOTE — Telephone Encounter (Signed)
Ok refill but needs ov w/fasting labs

## 2010-08-27 ENCOUNTER — Encounter: Payer: Self-pay | Admitting: Family Medicine

## 2010-08-28 ENCOUNTER — Encounter: Payer: Self-pay | Admitting: Family Medicine

## 2010-08-28 ENCOUNTER — Ambulatory Visit (INDEPENDENT_AMBULATORY_CARE_PROVIDER_SITE_OTHER): Payer: Medicare Other | Admitting: Family Medicine

## 2010-08-28 VITALS — BP 134/52 | HR 100 | Temp 98.1°F | Wt 170.0 lb

## 2010-08-28 DIAGNOSIS — I10 Essential (primary) hypertension: Secondary | ICD-10-CM

## 2010-08-28 DIAGNOSIS — E119 Type 2 diabetes mellitus without complications: Secondary | ICD-10-CM

## 2010-08-28 DIAGNOSIS — E785 Hyperlipidemia, unspecified: Secondary | ICD-10-CM

## 2010-08-28 LAB — HEPATIC FUNCTION PANEL
Albumin: 4.3 g/dL (ref 3.5–5.2)
Alkaline Phosphatase: 68 U/L (ref 39–117)
Total Bilirubin: 0.5 mg/dL (ref 0.3–1.2)

## 2010-08-28 LAB — POCT URINALYSIS DIPSTICK
Bilirubin, UA: NEGATIVE
Glucose, UA: NEGATIVE
Ketones, UA: NEGATIVE
Spec Grav, UA: 1.025

## 2010-08-28 LAB — BASIC METABOLIC PANEL
Calcium: 9.6 mg/dL (ref 8.4–10.5)
GFR: 68.03 mL/min (ref >60.00)
Sodium: 142 mEq/L (ref 135–145)

## 2010-08-28 LAB — LIPID PANEL
HDL: 50 mg/dL (ref >39.00)
LDL Cholesterol: 34 mg/dL (ref 0–99)
Total CHOL/HDL Ratio: 2
VLDL: 26.6 mg/dL (ref 0.0–40.0)

## 2010-08-28 LAB — CBC WITH DIFFERENTIAL/PLATELET
Basophils Relative: 0.3 % (ref 0.0–3.0)
HCT: 39.2 % (ref 39.0–52.0)
Hemoglobin: 13.1 g/dL (ref 13.0–17.0)
Lymphocytes Relative: 31.4 % (ref 12.0–46.0)
Lymphs Abs: 2.1 10*3/uL (ref 0.7–4.0)
Monocytes Relative: 7.5 % (ref 3.0–12.0)
Neutro Abs: 4.1 10*3/uL (ref 1.4–7.7)
RBC: 4.28 Mil/uL (ref 4.22–5.81)

## 2010-08-28 LAB — TSH: TSH: 0.27 u[IU]/mL — ABNORMAL LOW (ref 0.35–5.50)

## 2010-08-28 NOTE — Progress Notes (Signed)
  Subjective:    Patient ID: Christian [name], male    DOB: 08/10/1921, 77 y.o.   MRN: [id number]  HPI Here for follow up. He feels well in general. Seems to be eating a healthy diet. His glucoses are a bit labile at home, varying from 100 to 230. BP is stable   Review of Systems  Constitutional: Negative.   Respiratory: Negative.   Cardiovascular: Negative.   Gastrointestinal: Negative.        Objective:   Physical Exam  Constitutional: He appears well-developed and well-nourished.  Neck: No thyromegaly present.  Cardiovascular: Normal rate, regular rhythm, normal heart sounds and intact distal pulses.   Pulmonary/Chest: Effort normal and breath sounds normal.  Lymphadenopathy:    He has no cervical adenopathy.          Assessment & Plan:  He seems to be doing well. Get fasting labs today

## 2010-09-03 ENCOUNTER — Telehealth: Payer: Self-pay | Admitting: Family Medicine

## 2010-09-03 NOTE — Telephone Encounter (Signed)
Message copied by Baldemar Friday on Wed Sep 03, 2010  9:01 AM ------      Message from: Gershon Crane A      Created: Mon Sep 01, 2010 10:00 AM       Normal except his diabetes has gotten a bit worse. Watch the diet and we will discuss at the cpx.

## 2010-09-03 NOTE — Telephone Encounter (Signed)
Spoke with pt and gave results. 

## 2010-09-26 LAB — CBC
HCT: 40.9
Hemoglobin: 14
MCV: 87.8
RBC: 4.66
WBC: 6.5

## 2010-09-26 LAB — DIFFERENTIAL
Eosinophils Absolute: 0
Eosinophils Relative: 1
Lymphocytes Relative: 31
Lymphs Abs: 2
Monocytes Absolute: 0.6

## 2010-09-26 LAB — LIPID PANEL
Cholesterol: 152
HDL: 36 — ABNORMAL LOW
LDL Cholesterol: 92
Total CHOL/HDL Ratio: 4.2
Triglycerides: 118
VLDL: 24

## 2010-09-26 LAB — COMPREHENSIVE METABOLIC PANEL
ALT: 21
AST: 24
CO2: 25
Calcium: 9.8
Chloride: 103
GFR calc Af Amer: >60
GFR calc non Af Amer: >60
Potassium: 4.1
Sodium: 136

## 2010-09-26 LAB — PROTIME-INR
INR: 0.9
Prothrombin Time: 12.3

## 2010-09-26 LAB — TROPONIN I: Troponin I: 0.02

## 2010-09-26 LAB — HOMOCYSTEINE: Homocysteine: 14.5

## 2010-09-26 LAB — CK TOTAL AND CKMB (NOT AT ARMC): Relative Index: INVALID

## 2010-10-16 ENCOUNTER — Other Ambulatory Visit: Payer: Self-pay | Admitting: Cardiology

## 2010-10-30 ENCOUNTER — Other Ambulatory Visit: Payer: Self-pay | Admitting: Family Medicine

## 2010-10-30 NOTE — Telephone Encounter (Signed)
Script sent e-scribe 

## 2010-12-03 ENCOUNTER — Encounter: Payer: Self-pay | Admitting: Cardiology

## 2010-12-03 ENCOUNTER — Ambulatory Visit (INDEPENDENT_AMBULATORY_CARE_PROVIDER_SITE_OTHER): Payer: Medicare Other | Admitting: Cardiology

## 2010-12-03 VITALS — BP 140/67 | HR 93 | Ht 71.0 in | Wt 171.8 lb

## 2010-12-03 DIAGNOSIS — I359 Nonrheumatic aortic valve disorder, unspecified: Secondary | ICD-10-CM

## 2010-12-03 DIAGNOSIS — I6529 Occlusion and stenosis of unspecified carotid artery: Secondary | ICD-10-CM

## 2010-12-03 DIAGNOSIS — I452 Bifascicular block: Secondary | ICD-10-CM

## 2010-12-03 DIAGNOSIS — I779 Disorder of arteries and arterioles, unspecified: Secondary | ICD-10-CM

## 2010-12-03 DIAGNOSIS — Z952 Presence of prosthetic heart valve: Secondary | ICD-10-CM

## 2010-12-03 DIAGNOSIS — I38 Endocarditis, valve unspecified: Secondary | ICD-10-CM

## 2010-12-03 DIAGNOSIS — E119 Type 2 diabetes mellitus without complications: Secondary | ICD-10-CM

## 2010-12-03 DIAGNOSIS — I10 Essential (primary) hypertension: Secondary | ICD-10-CM

## 2010-12-03 NOTE — Progress Notes (Signed)
Christian [name] Date of Birth: 09/30/1921 Medical Record #[id number]  History of Present Illness: Christian [name] is seen for followup cardiac evaluation today. He has a history of severe aortic stenosis and is status post bioprosthetic aortic valve replacement in 1999. His last echocardiogram was in January 2009. He also has carotid arterial disease and is status post left carotid endarterectomy in 2010. His most recent carotid Doppler in May of 2012 showed less than 60% stenosis bilaterally. He denies any cardiac complaints. He specifically denies any chest pain, shortness of breath, or palpitations. His wife reports his diabetes has not been under good control and that he does have problems with dementia now. He is scheduled for a complete physical with Christian [name] next week.  Current Outpatient Prescriptions on File Prior to Visit  Medication Sig Dispense Refill  . aspirin 325 MG tablet Take 325 mg by mouth daily.        Marland Kitchen glyBURIDE (DIABETA) 5 MG tablet Take 1 tablet (5 mg total) by mouth 2 (two) times daily with a meal.  60 tablet  11  . lisinopril (PRINIVIL,ZESTRIL) 20 MG tablet TAKE 1 TABLET EVERY DAY  30 tablet  6  . meclizine (ANTIVERT) 25 MG tablet Take 25 mg by mouth as needed.        . metFORMIN (GLUCOPHAGE) 1000 MG tablet Take 1 tablet (1,000 mg total) by mouth 2 (two) times daily with a meal.  60 tablet  11  . multivitamin-lutein (OCUVITE-LUTEIN) CAPS Take 1 capsule by mouth daily.        . ONE TOUCH ULTRA TEST test strip USE ONE STRIP TO CHECK GLUCOSE EVERY DAY  100 each  2  . simvastatin (ZOCOR) 40 MG tablet TAKE 1 TABLET AT BEDTIME  30 tablet  3  . sitaGLIPtan (JANUVIA) 100 MG tablet Take 1 tablet (100 mg total) by mouth daily.  30 tablet  11    Allergies  Allergen Reactions  . Penicillins     Past Medical History  Diagnosis Date  . Flashers or floaters, right eye   . Hyperlipidemia   . DJD of shoulder   . Aortic stenosis     severe sees Christian [name]  . Mitral  insufficiency   . RBBB (right bundle branch block)   . LAFB (left anterior fascicular block)   . Shingles   . Diabetes mellitus   . Carotid artery disease     last dopplers on 05/23/09 were stable, LICA with 0-39% stenosis and RICA with 40-59%  . Acute arterial ischemic stroke, vertebrobasilar, thalamic 01/08  . Pneumonia 04/11    Past Surgical History  Procedure Date  . Tonsillectomy   . Colonoscopy 11/04    clear no repeats needed  . Aortic valve replacement     used bovine valve in 2000 per Dr. Laneta [name]  . Carotid endarterectomy 01/13/08    left per Christian [name]    History  Smoking status  . Former Smoker  Smokeless tobacco  . Never Used    History  Alcohol Use No    History reviewed. No pertinent family history.  Review of Systems: The review of systems is positive for some numbness in his right hand this morning when he first woke up. This has resolved.  All other systems were reviewed and are negative.  Physical Exam: BP 140/67  Pulse 93  Ht 5\' 11"  (1.803 m)  Wt 171 lb 12.8 oz (77.928 kg)  BMI 23.96 kg/m2 He is an elderly white male  in no acute distress.The patient is alert and oriented x 3.  The mood and affect are normal.  The skin is warm and dry.  Color is normal.  The HEENT exam reveals that the sclera are nonicteric.  The mucous membranes are moist.  The carotids are 2+ without bruits.  There is no thyromegaly.  There is no JVD.  The lungs are clear.  The chest wall is non tender.  The heart exam reveals a regular rate with a normal S1 and S2.  There is a soft grade 1/6 systolic murmur the right upper sternal border..  The PMI is not displaced.   Abdominal exam reveals good bowel sounds.  There is no guarding or rebound.  There is no hepatosplenomegaly or tenderness.  There are no masses.  Exam of the legs reveal no clubbing, cyanosis, or edema.  The legs are without rashes.  The distal pulses are intact.  Cranial nerves II - XII are intact.  Motor and sensory  functions are intact.  The gait is normal.  LABORATORY DATA: ECG demonstrates normal sinus rhythm with a left anterior fascicular block and right bundle branch block. This is unchanged from old ECGs.  Assessment / Plan:

## 2010-12-03 NOTE — Assessment & Plan Note (Signed)
As noted above his recent carotid Dopplers showed no obstructive disease. We will continue with risk factor modification including aspirin and statin therapy.

## 2010-12-03 NOTE — Assessment & Plan Note (Signed)
He is status post bioprosthetic aortic valve replacement in 1999. His exam today is fairly unremarkable. It has been almost 4 years since his last echo. We will update his echocardiogram today. I will plan on yearly followup.

## 2010-12-03 NOTE — Patient Instructions (Signed)
We will schedule you for an echocardiogram.  Continue your medication.  Follow up with Dr. Clent Ridges for your blood work.  I will see you again in 1 year.

## 2010-12-08 ENCOUNTER — Ambulatory Visit (INDEPENDENT_AMBULATORY_CARE_PROVIDER_SITE_OTHER): Payer: Medicare Other | Admitting: Family Medicine

## 2010-12-08 ENCOUNTER — Encounter: Payer: Self-pay | Admitting: Family Medicine

## 2010-12-08 VITALS — BP 128/70 | HR 69 | Temp 98.6°F | Ht 70.25 in | Wt 169.0 lb

## 2010-12-08 DIAGNOSIS — E119 Type 2 diabetes mellitus without complications: Secondary | ICD-10-CM

## 2010-12-08 DIAGNOSIS — Z125 Encounter for screening for malignant neoplasm of prostate: Secondary | ICD-10-CM

## 2010-12-08 DIAGNOSIS — Z0000 Encounter for general adult medical examination without abnormal findings: Secondary | ICD-10-CM

## 2010-12-08 LAB — LIPID PANEL
LDL Cholesterol: 45 mg/dL (ref 0–99)
Total CHOL/HDL Ratio: 3
VLDL: 28.8 mg/dL (ref 0.0–40.0)

## 2010-12-08 LAB — PSA: PSA: 0.73 ng/mL (ref 0.10–4.00)

## 2010-12-08 LAB — BASIC METABOLIC PANEL
BUN: 19 mg/dL (ref 6–23)
Chloride: 105 mEq/L (ref 96–112)
GFR: 75.09 mL/min (ref >60.00)
Potassium: 5.1 mEq/L (ref 3.5–5.1)
Sodium: 143 mEq/L (ref 135–145)

## 2010-12-08 LAB — HEPATIC FUNCTION PANEL
AST: 20 U/L (ref 0–37)
Albumin: 4.2 g/dL (ref 3.5–5.2)
Alkaline Phosphatase: 73 U/L (ref 39–117)
Total Bilirubin: 0.5 mg/dL (ref 0.3–1.2)

## 2010-12-08 LAB — CBC WITH DIFFERENTIAL/PLATELET
Basophils Relative: 0.3 % (ref 0.0–3.0)
Eosinophils Relative: 0.6 % (ref 0.0–5.0)
Hemoglobin: 13.3 g/dL (ref 13.0–17.0)
Lymphocytes Relative: 28.4 % (ref 12.0–46.0)
MCV: 91.5 fl (ref 78.0–100.0)
Neutro Abs: 5.2 10*3/uL (ref 1.4–7.7)
Neutrophils Relative %: 63.9 % (ref 43.0–77.0)
RBC: 4.34 Mil/uL (ref 4.22–5.81)
WBC: 8.1 10*3/uL (ref 4.5–10.5)

## 2010-12-08 LAB — POCT URINALYSIS DIPSTICK
Bilirubin, UA: NEGATIVE
Blood, UA: NEGATIVE
Glucose, UA: NEGATIVE
Nitrite, UA: NEGATIVE
pH, UA: 6

## 2010-12-08 NOTE — Progress Notes (Signed)
  Subjective:    Patient ID: Christian [name], male    DOB: 06/15/1921, 77 y.o.   MRN: [id number]  HPI 77 yr old male for a cpx. He feels well and has no concerns. He saw Dr. [place] recently and had a good heart check. He has some carotid dopplers pending. His dementia has progressed a bit per his wife, but she has been able to care for him at home so far.    Review of Systems  Constitutional: Negative.   HENT: Negative.   Eyes: Negative.   Respiratory: Negative.   Cardiovascular: Negative.   Gastrointestinal: Negative.   Genitourinary: Negative.   Musculoskeletal: Negative.   Skin: Negative.   Neurological: Negative.   Hematological: Negative.   Psychiatric/Behavioral: Negative.        Objective:   Physical Exam  Constitutional: He is oriented to person, place, and time. He appears well-developed and well-nourished. No distress.  HENT:  Head: Normocephalic and atraumatic.  Right Ear: External ear normal.  Left Ear: External ear normal.  Nose: Nose normal.  Mouth/Throat: Oropharynx is clear and moist. No oropharyngeal exudate.  Eyes: Conjunctivae and EOM are normal. Pupils are equal, round, and reactive to light. Right eye exhibits no discharge. Left eye exhibits no discharge. No scleral icterus.  Neck: Neck supple. No JVD present. No tracheal deviation present. No thyromegaly present.  Cardiovascular: Normal rate, regular rhythm, normal heart sounds and intact distal pulses.  Exam reveals no gallop and no friction rub.   No murmur heard. Pulmonary/Chest: Effort normal and breath sounds normal. No respiratory distress. He has no wheezes. He has no rales. He exhibits no tenderness.  Abdominal: Soft. Bowel sounds are normal. He exhibits no distension and no mass. There is no tenderness. There is no rebound and no guarding.  Genitourinary: Rectum normal, prostate normal and penis normal. Guaiac negative stool. No penile tenderness.  Musculoskeletal: Normal range of motion. He exhibits  no edema and no tenderness.  Lymphadenopathy:    He has no cervical adenopathy.  Neurological: He is alert and oriented to person, place, and time. He has normal reflexes. No cranial nerve deficit. He exhibits normal muscle tone. Coordination normal.  Skin: Skin is warm and dry. No rash noted. He is not diaphoretic. No erythema. No pallor.  Psychiatric: He has a normal mood and affect. His behavior is normal. Judgment and thought content normal.          Assessment & Plan:  Well exam. Get fasting labs today

## 2010-12-11 ENCOUNTER — Ambulatory Visit (HOSPITAL_COMMUNITY): Payer: Medicare Other | Attending: Cardiology | Admitting: Radiology

## 2010-12-11 DIAGNOSIS — Z8673 Personal history of transient ischemic attack (TIA), and cerebral infarction without residual deficits: Secondary | ICD-10-CM | POA: Insufficient documentation

## 2010-12-11 DIAGNOSIS — I079 Rheumatic tricuspid valve disease, unspecified: Secondary | ICD-10-CM | POA: Insufficient documentation

## 2010-12-11 DIAGNOSIS — E119 Type 2 diabetes mellitus without complications: Secondary | ICD-10-CM | POA: Insufficient documentation

## 2010-12-11 DIAGNOSIS — I10 Essential (primary) hypertension: Secondary | ICD-10-CM | POA: Insufficient documentation

## 2010-12-11 DIAGNOSIS — I779 Disorder of arteries and arterioles, unspecified: Secondary | ICD-10-CM | POA: Insufficient documentation

## 2010-12-11 DIAGNOSIS — I059 Rheumatic mitral valve disease, unspecified: Secondary | ICD-10-CM | POA: Insufficient documentation

## 2010-12-11 DIAGNOSIS — Z952 Presence of prosthetic heart valve: Secondary | ICD-10-CM

## 2010-12-11 DIAGNOSIS — I452 Bifascicular block: Secondary | ICD-10-CM

## 2010-12-11 DIAGNOSIS — I359 Nonrheumatic aortic valve disorder, unspecified: Secondary | ICD-10-CM | POA: Insufficient documentation

## 2010-12-11 NOTE — Progress Notes (Signed)
Quick Note:  Spoke with pt ______ 

## 2010-12-12 ENCOUNTER — Telehealth: Payer: Self-pay | Admitting: *Deleted

## 2010-12-12 NOTE — Telephone Encounter (Signed)
Notified wife of Echo results. Will send results to Dr. Clent Ridges

## 2010-12-12 NOTE — Telephone Encounter (Signed)
Message copied by Lorayne Bender on Fri Dec 12, 2010 12:14 PM ------      Message from: [place], PETER M      Created: Fri Dec 12, 2010  8:35 AM       Echo looks OK. Normal EF. AV prosthesis is functioning well. Moderate pulmonary HTN.      Continue Rx.      Theron Arista [place]

## 2011-01-03 ENCOUNTER — Other Ambulatory Visit: Payer: Self-pay

## 2011-01-03 ENCOUNTER — Emergency Department (HOSPITAL_COMMUNITY): Payer: Medicare Other

## 2011-01-03 ENCOUNTER — Encounter (HOSPITAL_COMMUNITY): Payer: Self-pay

## 2011-01-03 ENCOUNTER — Inpatient Hospital Stay (HOSPITAL_COMMUNITY)
Admission: EM | Admit: 2011-01-03 | Discharge: 2011-01-05 | DRG: 312 | Disposition: A | Discharge status: Home / Self Care | Payer: Medicare Other | Attending: Internal Medicine | Admitting: Internal Medicine

## 2011-01-03 DIAGNOSIS — I38 Endocarditis, valve unspecified: Secondary | ICD-10-CM

## 2011-01-03 DIAGNOSIS — J1100 Influenza due to unidentified influenza virus with unspecified type of pneumonia: Secondary | ICD-10-CM

## 2011-01-03 DIAGNOSIS — R55 Syncope and collapse: Principal | ICD-10-CM | POA: Diagnosis present

## 2011-01-03 DIAGNOSIS — R209 Unspecified disturbances of skin sensation: Secondary | ICD-10-CM

## 2011-01-03 DIAGNOSIS — I10 Essential (primary) hypertension: Secondary | ICD-10-CM | POA: Diagnosis present

## 2011-01-03 DIAGNOSIS — R5383 Other fatigue: Secondary | ICD-10-CM | POA: Diagnosis present

## 2011-01-03 DIAGNOSIS — R5381 Other malaise: Secondary | ICD-10-CM | POA: Diagnosis present

## 2011-01-03 DIAGNOSIS — E119 Type 2 diabetes mellitus without complications: Secondary | ICD-10-CM | POA: Diagnosis present

## 2011-01-03 DIAGNOSIS — J984 Other disorders of lung: Secondary | ICD-10-CM

## 2011-01-03 DIAGNOSIS — E860 Dehydration: Secondary | ICD-10-CM | POA: Diagnosis present

## 2011-01-03 DIAGNOSIS — F0390 Unspecified dementia without behavioral disturbance: Secondary | ICD-10-CM | POA: Diagnosis present

## 2011-01-03 DIAGNOSIS — I6350 Cerebral infarction due to unspecified occlusion or stenosis of unspecified cerebral artery: Secondary | ICD-10-CM

## 2011-01-03 DIAGNOSIS — I452 Bifascicular block: Secondary | ICD-10-CM | POA: Diagnosis present

## 2011-01-03 DIAGNOSIS — J111 Influenza due to unidentified influenza virus with other respiratory manifestations: Secondary | ICD-10-CM | POA: Diagnosis present

## 2011-01-03 DIAGNOSIS — Z79899 Other long term (current) drug therapy: Secondary | ICD-10-CM

## 2011-01-03 DIAGNOSIS — E785 Hyperlipidemia, unspecified: Secondary | ICD-10-CM | POA: Diagnosis present

## 2011-01-03 DIAGNOSIS — J40 Bronchitis, not specified as acute or chronic: Secondary | ICD-10-CM

## 2011-01-03 DIAGNOSIS — N401 Enlarged prostate with lower urinary tract symptoms: Secondary | ICD-10-CM

## 2011-01-03 DIAGNOSIS — Z7982 Long term (current) use of aspirin: Secondary | ICD-10-CM

## 2011-01-03 DIAGNOSIS — I6529 Occlusion and stenosis of unspecified carotid artery: Secondary | ICD-10-CM | POA: Diagnosis present

## 2011-01-03 DIAGNOSIS — R531 Weakness: Secondary | ICD-10-CM

## 2011-01-03 DIAGNOSIS — I658 Occlusion and stenosis of other precerebral arteries: Secondary | ICD-10-CM | POA: Diagnosis present

## 2011-01-03 DIAGNOSIS — R42 Dizziness and giddiness: Secondary | ICD-10-CM

## 2011-01-03 DIAGNOSIS — J189 Pneumonia, unspecified organism: Secondary | ICD-10-CM | POA: Diagnosis present

## 2011-01-03 DIAGNOSIS — I080 Rheumatic disorders of both mitral and aortic valves: Secondary | ICD-10-CM | POA: Diagnosis present

## 2011-01-03 DIAGNOSIS — Z880 Allergy status to penicillin: Secondary | ICD-10-CM

## 2011-01-03 LAB — COMPREHENSIVE METABOLIC PANEL
ALT: 8 U/L (ref 0–53)
AST: 15 U/L (ref 0–37)
Alkaline Phosphatase: 69 U/L (ref 39–117)
CO2: 25 mEq/L (ref 19–32)
Calcium: 8.7 mg/dL (ref 8.4–10.5)
Chloride: 101 mEq/L (ref 96–112)
GFR calc Af Amer: 90 mL/min — ABNORMAL LOW (ref >90)
GFR calc non Af Amer: 77 mL/min — ABNORMAL LOW (ref >90)
Glucose, Bld: 231 mg/dL — ABNORMAL HIGH (ref 70–99)
Sodium: 134 mEq/L — ABNORMAL LOW (ref 135–145)
Total Bilirubin: 0.3 mg/dL (ref 0.3–1.2)

## 2011-01-03 LAB — HEMOGLOBIN A1C
Hgb A1c MFr Bld: 7.2 % — ABNORMAL HIGH (ref <5.7)
Mean Plasma Glucose: 160 mg/dL — ABNORMAL HIGH (ref <117)

## 2011-01-03 LAB — CBC
HCT: 37.3 % — ABNORMAL LOW (ref 39.0–52.0)
Hemoglobin: 12.8 g/dL — ABNORMAL LOW (ref 13.0–17.0)
MCH: 30.5 pg (ref 26.0–34.0)
MCHC: 34.3 g/dL (ref 30.0–36.0)
MCV: 88.8 fL (ref 78.0–100.0)
Platelets: 141 10*3/uL — ABNORMAL LOW (ref 150–400)
RBC: 4.2 MIL/uL — ABNORMAL LOW (ref 4.22–5.81)
RDW: 13.8 % (ref 11.5–15.5)
WBC: 8.3 10*3/uL (ref 4.0–10.5)

## 2011-01-03 LAB — CARDIAC PANEL(CRET KIN+CKTOT+MB+TROPI)
CK, MB: 1.8 ng/mL (ref 0.3–4.0)
Relative Index: INVALID (ref 0.0–2.5)
Total CK: 74 U/L (ref 7–232)
Troponin I: <0.3 ng/mL (ref <0.30)

## 2011-01-03 LAB — BASIC METABOLIC PANEL
BUN: 16 mg/dL (ref 6–23)
CO2: 24 mEq/L (ref 19–32)
Calcium: 9.2 mg/dL (ref 8.4–10.5)
Chloride: 100 mEq/L (ref 96–112)
Creatinine, Ser: 0.77 mg/dL (ref 0.50–1.35)
GFR calc Af Amer: >90 mL/min (ref >90)
GFR calc non Af Amer: 79 mL/min — ABNORMAL LOW (ref >90)
Glucose, Bld: 224 mg/dL — ABNORMAL HIGH (ref 70–99)
Potassium: 4.6 mEq/L (ref 3.5–5.1)
Sodium: 135 mEq/L (ref 135–145)

## 2011-01-03 LAB — URINE MICROSCOPIC-ADD ON

## 2011-01-03 LAB — DIFFERENTIAL
Basophils Absolute: 0 10*3/uL (ref 0.0–0.1)
Basophils Relative: 0 % (ref 0–1)
Eosinophils Absolute: 0 10*3/uL (ref 0.0–0.7)
Eosinophils Relative: 0 % (ref 0–5)
Lymphocytes Relative: 11 % — ABNORMAL LOW (ref 12–46)
Lymphs Abs: 0.9 10*3/uL (ref 0.7–4.0)
Monocytes Absolute: 1 10*3/uL (ref 0.1–1.0)
Monocytes Relative: 12 % (ref 3–12)
Neutro Abs: 6.4 10*3/uL (ref 1.7–7.7)
Neutrophils Relative %: 77 % (ref 43–77)

## 2011-01-03 LAB — INFLUENZA PANEL BY PCR (TYPE A & B)
H1N1 flu by pcr: NOT DETECTED
Influenza A By PCR: POSITIVE — AB
Influenza B By PCR: NEGATIVE

## 2011-01-03 LAB — URINALYSIS, ROUTINE W REFLEX MICROSCOPIC
Bilirubin Urine: NEGATIVE
Glucose, UA: 100 mg/dL — AB
Hgb urine dipstick: NEGATIVE
Ketones, ur: NEGATIVE mg/dL
Leukocytes, UA: NEGATIVE
Nitrite: NEGATIVE
Protein, ur: 30 mg/dL — AB
Specific Gravity, Urine: 1.014 (ref 1.005–1.030)
Urobilinogen, UA: 1 mg/dL (ref 0.0–1.0)
pH: 7.5 (ref 5.0–8.0)

## 2011-01-03 LAB — APTT: aPTT: 30 seconds (ref 24–37)

## 2011-01-03 LAB — TSH: TSH: 0.223 u[IU]/mL — ABNORMAL LOW (ref 0.350–4.500)

## 2011-01-03 LAB — GLUCOSE, CAPILLARY: Glucose-Capillary: 197 mg/dL — ABNORMAL HIGH (ref 70–99)

## 2011-01-03 MED ORDER — SODIUM CHLORIDE 0.9 % IV SOLN
INTRAVENOUS | Status: DC
  Administered 2011-01-03 – 2011-01-05 (×5): via INTRAVENOUS

## 2011-01-03 MED ORDER — DEXTROSE 5 % IV SOLN
1.0000 g | Freq: Once | INTRAVENOUS | Status: AC
  Administered 2011-01-03: 1 g via INTRAVENOUS
  Filled 2011-01-03: qty 10

## 2011-01-03 MED ORDER — MOXIFLOXACIN HCL IN NACL 400 MG/250ML IV SOLN
400.0000 mg | INTRAVENOUS | Status: DC
  Administered 2011-01-04 – 2011-01-05 (×2): 400 mg via INTRAVENOUS
  Filled 2011-01-03 (×2): qty 250

## 2011-01-03 MED ORDER — ASPIRIN 325 MG PO TABS
325.0000 mg | ORAL_TABLET | Freq: Every day | ORAL | Status: DC
  Administered 2011-01-04 – 2011-01-05 (×2): 325 mg via ORAL
  Filled 2011-01-03 (×2): qty 1

## 2011-01-03 MED ORDER — INSULIN ASPART 100 UNIT/ML ~~LOC~~ SOLN
0.0000 [IU] | Freq: Three times a day (TID) | SUBCUTANEOUS | Status: DC
  Administered 2011-01-05: 1 [IU] via SUBCUTANEOUS
  Administered 2011-01-05: 2 [IU] via SUBCUTANEOUS
  Filled 2011-01-03: qty 3

## 2011-01-03 MED ORDER — ONDANSETRON HCL 4 MG PO TABS: 4.0000 mg | ORAL_TABLET | Freq: Four times a day (QID) | ORAL | Status: DC | PRN

## 2011-01-03 MED ORDER — IPRATROPIUM BROMIDE 0.02 % IN SOLN: 0.5000 mg | RESPIRATORY_TRACT | Status: DC | PRN

## 2011-01-03 MED ORDER — ALBUTEROL SULFATE (5 MG/ML) 0.5% IN NEBU
5.0000 mg | INHALATION_SOLUTION | Freq: Once | RESPIRATORY_TRACT | Status: DC
  Filled 2011-01-03: qty 1

## 2011-01-03 MED ORDER — DEXTROSE 5 % IV SOLN: 500.0000 mg | Freq: Once | INTRAVENOUS | Status: DC

## 2011-01-03 MED ORDER — LISINOPRIL 5 MG PO TABS
5.0000 mg | ORAL_TABLET | Freq: Every day | ORAL | Status: DC
  Administered 2011-01-03 – 2011-01-04 (×2): 5 mg via ORAL
  Filled 2011-01-03 (×2): qty 1

## 2011-01-03 MED ORDER — SIMVASTATIN 20 MG PO TABS
20.0000 mg | ORAL_TABLET | Freq: Every day | ORAL | Status: DC
  Administered 2011-01-03 – 2011-01-04 (×2): 20 mg via ORAL
  Filled 2011-01-03 (×3): qty 1

## 2011-01-03 MED ORDER — ALBUTEROL SULFATE (5 MG/ML) 0.5% IN NEBU: 2.5000 mg | INHALATION_SOLUTION | RESPIRATORY_TRACT | Status: DC | PRN

## 2011-01-03 MED ORDER — LINAGLIPTIN 5 MG PO TABS
5.0000 mg | ORAL_TABLET | Freq: Every day | ORAL | Status: DC
  Administered 2011-01-03 – 2011-01-05 (×3): 5 mg via ORAL
  Filled 2011-01-03 (×3): qty 1

## 2011-01-03 MED ORDER — GLYBURIDE 5 MG PO TABS
5.0000 mg | ORAL_TABLET | Freq: Two times a day (BID) | ORAL | Status: DC
  Administered 2011-01-03 – 2011-01-05 (×4): 5 mg via ORAL
  Filled 2011-01-03 (×7): qty 1

## 2011-01-03 MED ORDER — MECLIZINE HCL 25 MG PO TABS
25.0000 mg | ORAL_TABLET | Freq: Two times a day (BID) | ORAL | Status: DC
  Administered 2011-01-03 – 2011-01-05 (×4): 25 mg via ORAL
  Filled 2011-01-03 (×5): qty 1

## 2011-01-03 MED ORDER — OCUVITE-LUTEIN PO CAPS
1.0000 | ORAL_CAPSULE | Freq: Every day | ORAL | Status: DC
  Administered 2011-01-03 – 2011-01-05 (×3): 1 via ORAL
  Filled 2011-01-03 (×3): qty 1

## 2011-01-03 MED ORDER — SODIUM CHLORIDE 0.9 % IV BOLUS (SEPSIS)
250.0000 mL | Freq: Once | INTRAVENOUS | Status: DC
  Administered 2011-01-03: 250 mL via INTRAVENOUS

## 2011-01-03 MED ORDER — INSULIN ASPART 100 UNIT/ML ~~LOC~~ SOLN: 0.0000 [IU] | Freq: Every day | SUBCUTANEOUS | Status: DC

## 2011-01-03 MED ORDER — ONDANSETRON HCL 4 MG/2ML IJ SOLN: 4.0000 mg | Freq: Four times a day (QID) | INTRAMUSCULAR | Status: DC | PRN

## 2011-01-03 MED ORDER — ENOXAPARIN SODIUM 30 MG/0.3ML ~~LOC~~ SOLN: 30.0000 mg | SUBCUTANEOUS | Status: DC

## 2011-01-03 NOTE — ED Notes (Signed)
Patient transported to MRI 

## 2011-01-03 NOTE — ED Notes (Signed)
Pt returned from MRI, monitor applied and patient transported to [id number], wife at the bedside

## 2011-01-03 NOTE — ED Notes (Signed)
Pt knows that urine is needed 

## 2011-01-03 NOTE — H&P (Signed)
PCP:  Christian Salisbury, MD   DOA:  01/03/2011  9:08 AM  Chief Complaint:  Generalized weakness  HPI: 77 year old male with history of dementia, DM, HTN, Dyslipidemia  Who presents to ED with complaints of upper respiratory symptoms and generalized weakanes over last few days prior to admission. As per patient's wife, patient has been feeling weak and had subjective fevers, some cough productive of clear sputum. Patient's wife reports patient had no complaints of chest pain, no shortness of breath, no nausea or vomiting, no abdominal pain, no blood in stool or urine. Patient has underlying dementia and is not a good historian so the information was provided by wife and ED physician.  Allergies: Allergies  Allergen Reactions  . Penicillins Rash    Prior to Admission medications   Medication Sig Start Date End Date Taking? Authorizing Provider  aspirin 325 MG tablet Take 325 mg by mouth daily.     Yes Historical Provider, MD  glyBURIDE (DIABETA) 5 MG tablet Take 1 tablet (5 mg total) by mouth 2 (two) times daily with a meal. 06/03/10  Yes Christian [name]  lisinopril (PRINIVIL,ZESTRIL) 20 MG tablet TAKE 1 TABLET EVERY DAY 08/06/10  Yes Christian [name]  metFORMIN (GLUCOPHAGE) 1000 MG tablet Take 1 tablet (1,000 mg total) by mouth 2 (two) times daily with a meal. 06/03/10  Yes Christian [name]  multivitamin-lutein [facility]) CAPS Take 1 capsule by mouth daily.     Yes Historical Provider, MD  simvastatin (ZOCOR) 40 MG tablet TAKE 1 TABLET AT BEDTIME 10/16/10  Yes Christian [place], MD  sitaGLIPtan (JANUVIA) 100 MG tablet Take 1 tablet (100 mg total) by mouth daily. 05/05/10 05/05/11 Yes Christian [name]  meclizine (ANTIVERT) 25 MG tablet Take 25 mg by mouth as needed. FOR DIZZINESS    Historical Provider, MD  ONE TOUCH ULTRA TEST test strip USE ONE STRIP TO CHECK GLUCOSE EVERY DAY 10/30/10   Christian [name]    Past Medical History  Diagnosis Date  . Flashers or floaters, right eye   . Hyperlipidemia   . DJD  of shoulder   . Aortic stenosis     severe sees Dr. Peter [name]  . Mitral insufficiency   . RBBB (right bundle branch block)   . LAFB (left anterior fascicular block)   . Shingles   . Diabetes mellitus   . Carotid artery disease     last dopplers on 05/23/09 were stable, LICA with 0-39% stenosis and RICA with 40-59%  . Acute arterial ischemic stroke, vertebrobasilar, thalamic 01/08  . Pneumonia 04/11    Past Surgical History  Procedure Date  . Tonsillectomy   . Colonoscopy 11/04    clear no repeats needed  . Aortic valve replacement     used bovine valve in 2000 per Dr. Laneta [name]  . Carotid endarterectomy 01/13/08    left per Dr. Hart [name]    Social History:  reports that he has quit smoking. He has never used smokeless tobacco. He reports that he does not drink alcohol or use illicit drugs.  History reviewed. No pertinent family history.  Review of Systems:  Constitutional: Denies fever, chills, diaphoresis, appetite change and fatigue.  HEENT: Denies photophobia, eye pain, redness, hearing loss, ear pain, congestion, sore throat, rhinorrhea, sneezing, mouth sores, trouble swallowing, neck pain, neck stiffness and tinnitus.   Respiratory: Denies SOB, DOE, cough, chest tightness,  and wheezing.   Cardiovascular: Denies chest pain, palpitations and leg swelling.  Gastrointestinal: Denies nausea, vomiting, abdominal  pain, diarrhea, constipation, blood in stool and abdominal distention.  Genitourinary: Denies dysuria, urgency, frequency, hematuria, flank pain and difficulty urinating.  Musculoskeletal: Denies myalgias, back pain, joint swelling, arthralgias and gait problem.  Skin: Denies pallor, rash and wound.  Neurological: Denies dizziness, seizures, syncope, weakness, light-headedness, numbness and headaches.  Hematological: Denies adenopathy. Easy bruising, personal or family bleeding history  Psychiatric/Behavioral: Denies suicidal ideation, mood changes, confusion, nervousness,  sleep disturbance and agitation   Physical Exam:  Filed Vitals:   01/03/11 0919 01/03/11 1008 01/03/11 1237 01/03/11 1314  BP: 142/55   139/63  Pulse: 103     Temp: 98.4 F (36.9 C) 99.1 F (37.3 C) 99 F (37.2 C)   TempSrc: Oral Rectal Oral   Resp: 18   16  SpO2: 96%   92%    Constitutional: Vital signs reviewed. In no acute distress. Head: Normocephalic and atraumatic Ear: TM normal bilaterally Mouth: no erythema or exudates, MMM Eyes: PERRL, EOMI, conjunctivae normal, No scleral icterus.  Neck: Supple, Trachea midline normal ROM, No JVD, mass, thyromegaly, or carotid bruit present.  Cardiovascular: RRR, S1 normal, S2 normal, no MRG, pulses symmetric and intact bilaterally Pulmonary/Chest: CTAB, no wheezes, rales, or rhonchi Abdominal: Soft. Non-tender, non-distended, bowel sounds are normal, no masses, organomegaly, or guarding present.  GU: no CVA tenderness Musculoskeletal: No joint deformities, erythema, or stiffness, ROM full and no nontender Ext: no edema and no cyanosis, pulses palpable bilaterally (DP and PT) Hematology: no cervical, inginal, or axillary adenopathy.  Neurological: A&O x3, Strenght is normal and symmetric bilaterally, cranial nerve II-XII are grossly intact, no focal motor deficit, sensory intact to light touch bilaterally.  Skin: Warm, dry and intact. No rash, cyanosis, or clubbing.  Psychiatric: Normal mood and affect. speech and behavior is normal. Judgment and thought content normal. Cognition and memory are normal.   Labs on Admission:  Results for orders placed during the hospital encounter of 01/03/11 (from the past 48 hour(s))  CBC     Status: Abnormal   Collection Time   01/03/11  9:37 AM      Component Value Range Comment   WBC 8.3  4.0 - 10.5 (K/uL)    RBC 4.20 (*) 4.22 - 5.81 (MIL/uL)    Hemoglobin 12.8 (*) 13.0 - 17.0 (g/dL)    HCT 57.8 (*) 46.9 - 52.0 (%)    MCV 88.8  78.0 - 100.0 (fL)    MCH 30.5  26.0 - 34.0 (pg)    MCHC 34.3   30.0 - 36.0 (g/dL)    RDW 62.9  52.8 - 41.3 (%)    Platelets 141 (*) 150 - 400 (K/uL)   DIFFERENTIAL     Status: Abnormal   Collection Time   01/03/11  9:37 AM      Component Value Range Comment   Neutrophils Relative 77  43 - 77 (%)    Neutro Abs 6.4  1.7 - 7.7 (K/uL)    Lymphocytes Relative 11 (*) 12 - 46 (%)    Lymphs Abs 0.9  0.7 - 4.0 (K/uL)    Monocytes Relative 12  3 - 12 (%)    Monocytes Absolute 1.0  0.1 - 1.0 (K/uL)    Eosinophils Relative 0  0 - 5 (%)    Eosinophils Absolute 0.0  0.0 - 0.7 (K/uL)    Basophils Relative 0  0 - 1 (%)    Basophils Absolute 0.0  0.0 - 0.1 (K/uL)   BASIC METABOLIC PANEL     Status:  Abnormal   Collection Time   01/03/11  9:37 AM      Component Value Range Comment   Sodium 135  135 - 145 (mEq/L)    Potassium 4.6  3.5 - 5.1 (mEq/L)    Chloride 100  96 - 112 (mEq/L)    CO2 24  19 - 32 (mEq/L)    Glucose, Bld 224 (*) 70 - 99 (mg/dL)    BUN 16  6 - 23 (mg/dL)    Creatinine, Ser 0.45  0.50 - 1.35 (mg/dL)    Calcium 9.2  8.4 - 10.5 (mg/dL)    GFR calc non Af Amer 79 (*) >90 (mL/min)    GFR calc Af Amer >90  >90 (mL/min)   GLUCOSE, CAPILLARY     Status: Abnormal   Collection Time   01/03/11 10:11 AM      Component Value Range Comment   Glucose-Capillary 197 (*) 70 - 99 (mg/dL)   URINALYSIS, ROUTINE W REFLEX MICROSCOPIC     Status: Abnormal   Collection Time   01/03/11 12:28 PM      Component Value Range Comment   Color, Urine YELLOW  YELLOW     APPearance CLEAR  CLEAR     Specific Gravity, Urine 1.014  1.005 - 1.030     pH 7.5  5.0 - 8.0     Glucose, UA 100 (*) NEGATIVE (mg/dL)    Hgb urine dipstick NEGATIVE  NEGATIVE     Bilirubin Urine NEGATIVE  NEGATIVE     Ketones, ur NEGATIVE  NEGATIVE (mg/dL)    Protein, ur 30 (*) NEGATIVE (mg/dL)    Urobilinogen, UA 1.0  0.0 - 1.0 (mg/dL)    Nitrite NEGATIVE  NEGATIVE     Leukocytes, UA NEGATIVE  NEGATIVE    URINE MICROSCOPIC-ADD ON     Status: Abnormal   Collection Time   01/03/11 12:28 PM       Component Value Range Comment   Squamous Epithelial / LPF FEW (*) RARE      Radiological Exams on Admission: No results found.  Assessment/Plan:  Principal Problem:   *SYNCOPE - unclear whether patient truly lost consciousness or was there a period of seizures - we will obtain MRI brain without contrast and EEG - we will continue IV fluids NS @ 125 cc/hr - cycle cardiac enzymes x 3 to rule out cardiac arrythmia as a cause  Active Problems:   GENERALIZED WEAKNESS - likely secondary to dehydration versus infection (upper respiratory) pneumonia / influenza - we will give IV fluids - monitor BP   PNEUMONIA AND INFLUENZA - we will obtain influenza by PCR - follow up the results of blood cultures, urinalysis and urine culture - start Avalox 400 mg IV daily - nebulizer treatments as needed every 4 hours - we will provide oxygen support via nasal canula 2 L  DIABETES MELLITUS - check A1c - Continue glyburide but discontinue metformin for now - start sliding scale insulin - CBG monitoring   EDUCATION - test results and diagnostic studies were discussed with patient and pt's family who was present at the bedside - patient and family have verbalized the understanding - questions were answered at the bedside and contact information was provided for additional questions or concerns    Time Spent on Admission: GREATER THAN 30 MINUTES  Christian [name] 01/03/2011, 3:10 PM

## 2011-01-03 NOTE — ED Notes (Signed)
Per EMS, pt wife reported shaking and inability to respond during the shaking, no HX of seizures, dementia, last few days with fever, sugars running high, alert with no complaints

## 2011-01-03 NOTE — ED Notes (Signed)
Patient transported to X-ray 

## 2011-01-04 LAB — BASIC METABOLIC PANEL
CO2: 25 mEq/L (ref 19–32)
Chloride: 104 mEq/L (ref 96–112)
Creatinine, Ser: 0.8 mg/dL (ref 0.50–1.35)
GFR calc Af Amer: >90 mL/min (ref >90)
Potassium: 3.9 mEq/L (ref 3.5–5.1)
Sodium: 139 mEq/L (ref 135–145)

## 2011-01-04 LAB — CBC
HCT: 38.1 % — ABNORMAL LOW (ref 39.0–52.0)
MCV: 89 fL (ref 78.0–100.0)
RBC: 4.28 MIL/uL (ref 4.22–5.81)
RDW: 13.9 % (ref 11.5–15.5)
WBC: 6.1 10*3/uL (ref 4.0–10.5)

## 2011-01-04 LAB — GLUCOSE, CAPILLARY
Glucose-Capillary: 143 mg/dL — ABNORMAL HIGH (ref 70–99)
Glucose-Capillary: 142 mg/dL — ABNORMAL HIGH (ref 70–99)

## 2011-01-04 LAB — CARDIAC PANEL(CRET KIN+CKTOT+MB+TROPI): Total CK: 100 U/L (ref 7–232)

## 2011-01-04 MED ORDER — LISINOPRIL 10 MG PO TABS
10.0000 mg | ORAL_TABLET | Freq: Every day | ORAL | Status: DC
  Administered 2011-01-05: 10 mg via ORAL
  Filled 2011-01-04: qty 1

## 2011-01-04 MED ORDER — OSELTAMIVIR PHOSPHATE 75 MG PO CAPS
75.0000 mg | ORAL_CAPSULE | Freq: Two times a day (BID) | ORAL | Status: DC
  Administered 2011-01-04 – 2011-01-05 (×2): 75 mg via ORAL
  Filled 2011-01-04 (×6): qty 1

## 2011-01-04 NOTE — Progress Notes (Signed)
Patient ID: Christian [name], male   DOB: 17-Aug-1921, 77 y.o.   MRN: [id number] Subjective: No events overnight. Patient denies chest pain, shortness of breath, abdominal pain.   Objective:  Vital signs in last 24 hours:  Filed Vitals:   01/03/11 1500 01/03/11 2036 01/04/11 0500 01/04/11 0713  BP: 179/72 169/78 166/78   Pulse: 98 95 99   Temp: 98.1 F (36.7 C) 98.4 F (36.9 C) 98.3 F (36.8 C)   TempSrc: Oral Oral    Resp: 18 16 20    Height: 6' (1.829 m)     Weight: 75.297 kg (166 lb)   75.705 kg (166 lb 14.4 oz)  SpO2: 94% 94% 92%     Intake/Output from previous day:   Intake/Output Summary (Last 24 hours) at 01/04/11 1428 Last data filed at 01/04/11 0700  Gross per 24 hour  Intake   1240 ml  Output   1300 ml  Net    -60 ml    Physical Exam: General: in no acute distress. HEENT: No bruits, no goiter. Moist mucous membranes, no scleral icterus, no conjunctival pallor. Heart: Regular rate and rhythm, S1/S2 +, no murmurs, rubs, gallops. Lungs: Clear to auscultation bilaterally. No wheezing, no rhonchi, no rales.  Abdomen: Soft, nontender, nondistended, positive bowel sounds. Extremities: No clubbing or cyanosis, no pitting edema,  positive pedal pulses. Neuro: Grossly nonfocal.  Lab Results:  Basic Metabolic Panel:    Component Value Date/Time   NA 139 01/04/2011 0010   K 3.9 01/04/2011 0010   CL 104 01/04/2011 0010   CO2 25 01/04/2011 0010   BUN 14 01/04/2011 0010   CREATININE 0.80 01/04/2011 0010   GLUCOSE 121* 01/04/2011 0010   GLUCOSE 135* 11/02/2005 1101   CALCIUM 9.1 01/04/2011 0010   CBC:    Component Value Date/Time   WBC 6.1 01/04/2011 0010   HGB 12.8* 01/04/2011 0010   HCT 38.1* 01/04/2011 0010   PLT 141* 01/04/2011 0010   MCV 89.0 01/04/2011 0010   NEUTROABS 6.4 01/03/2011 0937   LYMPHSABS 0.9 01/03/2011 0937   MONOABS 1.0 01/03/2011 0937   EOSABS 0.0 01/03/2011 0937   BASOSABS 0.0 01/03/2011 0937      Lab 01/04/11 0010 01/03/11 0937  WBC  6.1 8.3  HGB 12.8* 12.8*  HCT 38.1* 37.3*  PLT 141* 141*  MCV 89.0 88.8  MCH 29.9 30.5  MCHC 33.6 34.3  RDW 13.9 13.8  LYMPHSABS -- 0.9  MONOABS -- 1.0  EOSABS -- 0.0  BASOSABS -- 0.0  BANDABS -- --    Lab 01/04/11 0010 01/03/11 1437 01/03/11 0937  NA 139 134* 135  K 3.9 4.3 4.6  CL 104 101 100  CO2 25 25 24   GLUCOSE 121* 231* 224*  BUN 14 15 16   CREATININE 0.80 0.82 0.77  CALCIUM 9.1 8.7 9.2  MG -- 1.8 --    Lab 01/03/11 1437  INR 1.03  PROTIME --   Cardiac markers:  Lab 01/04/11 0810 01/04/11 0010 01/03/11 1439  CKMB 2.5 2.2 1.8  TROPONINI <0.30 <0.30 <0.30  MYOGLOBIN -- -- --   No components found with this basename: POCBNP:3 No results found for this or any previous visit (from the past 240 hour(s)).  Studies/Results: Dg Chest 2 View  01/03/2011  *RADIOLOGY REPORT*  Clinical Data: Fever and cough.  CHEST - 2 VIEW  Comparison: 04/08/2009  Findings: There is slight peribronchial thickening consistent with bronchitis.  The interstitial markings are slightly accentuated at the bases but there are no consolidative  infiltrates or effusions. Heart size and vascularity are normal.  Evidence of prior aortic valve replacement.  No acute osseous abnormality.  IMPRESSION: Bronchitic changes.  Original Report Authenticated By: Gwynn Burly, M.D.   Ct Head Wo Contrast  01/03/2011  *RADIOLOGY REPORT*  Clinical Data: Seizure-like activity this morning.  Lethargic.  CT HEAD WITHOUT CONTRAST  Technique:  Contiguous axial images were obtained from the base of the skull through the vertex without contrast.  Comparison: Head CT 04/08/2009  Findings: Stable pattern of chronic microvascular ischemic changes bilaterally.  Stable ventricular size and cortical atrophy. Negative for hemorrhage, hydrocephalus, mass effect, mass lesion, or evidence of acute cortically based infarction.  Atherosclerotic calcification of the intracranial vasculature.  Mild mucosal thickening of some of the  ethmoid air cells. Otherwise, the sinuses are clear.  Mastoid air cells are clear. The skull is intact.  IMPRESSION:  1.  No acute intracranial abnormality. 2.  Extensive chronic microvascular ischemic changes. 3.  Mild chronic ethmoid sinusitis.  Original Report Authenticated By: Britta Mccreedy, M.D.   Mr Brain Wo Contrast  01/03/2011  *RADIOLOGY REPORT*  Clinical Data: Seizures.  Generalized weakness.  MRI HEAD WITHOUT CONTRAST  Technique:  Multiplanar, multiecho pulse sequences of the brain and surrounding structures were obtained according to standard protocol without intravenous contrast.  Comparison: CT head without contrast 01/03/2011.  The  Findings: There is some artifact from dental work anteriorly. Moderate generalized atrophy is present.  The diffusion weighted images demonstrate no evidence for acute or subacute infarction. Marked vermian atrophy is present.  There is disproportionate cerebellar atrophy as well.  Remote lacunar infarcts are present in the cerebellum bilaterally.  Moderate generalized cerebral atrophy is present bilaterally.  A remote lacunar infarct of the left thalamus is again noted.  Abnormal flow signal is again noted within the left vertebral artery, likely representing chronic occlusion.  Flow is otherwise present within the major intracranial arteries.  The patient is status post bilateral lens extractions.  Mild mucosal thickening is evident throughout the ethmoid air cells and within the left maxillary sinus.  Minimal fluid is present in the left mastoid air cells.  IMPRESSION:  1.  No acute or focal intracranial abnormality to explain seizures or new neurologic changes. 2.  Stable atrophy and diffuse white matter disease. 3.  Disproportionate atrophy of the cerebellum and in particular vermian atrophy as previously seen. 4.  Abnormal signal in the distal left vertebral artery compatible with chronic occlusion is previously noted.  Original Report Authenticated By:  Jamesetta Orleans. MATTERN, M.D.    Medications: Scheduled Meds:   . aspirin  325 mg Oral Daily  . azithromycin  500 mg Intravenous Once  . cefTRIAXone (ROCEPHIN)  IV  1 g Intravenous Once  . glyBURIDE  5 mg Oral BID WC  . insulin aspart  0-5 Units Subcutaneous QHS  . insulin aspart  0-9 Units Subcutaneous TID WC  . linagliptin  5 mg Oral Daily  . lisinopril  5 mg Oral Daily  . meclizine  25 mg Oral BID  . moxifloxacin  400 mg Intravenous Q24H  . multivitamin-lutein  1 capsule Oral Daily  . oseltamivir  75 mg Oral BID  . simvastatin  20 mg Oral q1800   Continuous Infusions:   . sodium chloride 125 mL/hr at 01/04/11 0557   PRN Meds:.albuterol, ipratropium, ondansetron (ZOFRAN) IV, ondansetron  Assessment/Plan:   Principal Problem:   *SYNCOPE  - unclear etiology  - resolved at present - MRI brain is normal,  follow up EEG results - we will continue IV fluids NS @ 125 cc/hr  - cycle cardiac enzymes x 3 to rule out cardiac arrythmia as a cause   Active Problems:   GENERALIZED WEAKNESS  - likely secondary to dehydration versus infection (upper respiratory) pneumonia / influenza  - we will give IV fluids  - monitor BP   PNEUMONIA AND INFLUENZA  - Influenza A positive by PCR, started tamiflu 01/04/11 - follow up the results of blood cultures, urinalysis and urine culture  - started Avalox 400 mg IV daily (01/03/11) - nebulizer treatments as needed every 4 hours  - we will provide oxygen support via nasal canula 2 L  DIABETES MELLITUS  - Continue glyburide  - start sliding scale insulin  - CBG monitoring   EDUCATION - test results and diagnostic studies were discussed with patient and pt's family who was present at the bedside - patient and family have verbalized the understanding - questions were answered at the bedside and contact information was provided for additional questions or concerns   LOS: 1 day   DEVINE, ALMA 01/04/2011, 2:28 PM  [facility]  HOSPITALIST Pager: [phone]

## 2011-01-04 NOTE — Progress Notes (Signed)
Physical Therapy Evaluation Patient Details Name: Christian [name] MRN: [id number] DOB: 04-28-1921 Today's Date: 01/04/2011  Problem List:  Patient Active Problem List  Diagnoses  . DIABETES MELLITUS, TYPE II  . HYPERTENSION, ESSENTIAL NOS  . VALVULAR HEART DISEASE  . CAROTID ARTERY DISEASE  . CEREBROVASCULAR ACCIDENT  . PNEUMONIA  . PULMONARY NODULE  . BENIGN PROSTATIC HYPERTROPHY, WITH OBSTRUCTION  . DIZZINESS  . ARM NUMBNESS  . Syncope  . Generalized weakness  . Pneumonia and influenza    Past Medical History:  Past Medical History  Diagnosis Date  . Flashers or floaters, right eye   . Hyperlipidemia   . DJD of shoulder   . Aortic stenosis     severe sees Dr. Peter [place]  . Mitral insufficiency   . RBBB (right bundle branch block)   . LAFB (left anterior fascicular block)   . Shingles   . Diabetes mellitus   . Carotid artery disease     last dopplers on 05/23/09 were stable, LICA with 0-39% stenosis and RICA with 40-59%  . Acute arterial ischemic stroke, vertebrobasilar, thalamic 01/08  . Pneumonia 04/11   Past Surgical History:  Past Surgical History  Procedure Date  . Tonsillectomy   . Colonoscopy 11/04    clear no repeats needed  . Aortic valve replacement     used bovine valve in 2000 per Dr. Laneta Simmers  . Carotid endarterectomy 01/13/08    left per Dr. Hart [place]    PT Assessment/Plan/Recommendation PT Assessment Clinical Impression Statement: 77 yo male admitted with generalized weakness, flu-like illness presents with decr functional mobility; will benefit from acute PT to maximize independence and safety with mobility, amb to enable safe dc home PT Recommendation/Assessment: Patient will need skilled PT in the acute care venue PT Problem List: Decreased strength;Decreased activity tolerance;Decreased balance;Decreased mobility;Decreased coordination;Decreased cognition;Decreased knowledge of use of DME;Decreased safety awareness PT Therapy Diagnosis : Difficulty  walking;Abnormality of gait;Generalized weakness PT Plan PT Frequency: Min 3X/week PT Treatment/Interventions: DME instruction;Gait training;Stair training;Functional mobility training;Therapeutic activities;Therapeutic exercise;Balance training;Patient/family education PT Recommendation Recommendations for Other Services: OT consult Follow Up Recommendations: Home health PT;24 hour supervision/assistance Equipment Recommended: 3 in 1 bedside comode;Rolling walker with 5" wheels (RW if pt does not already have one) PT Goals  Acute Rehab PT Goals PT Goal Formulation: Patient unable to participate in goal setting Time For Goal Achievement: 2 weeks Pt will go Supine/Side to Sit: with supervision Pt will go Sit to Supine/Side: with supervision Pt will go Sit to Stand: with supervision Pt will go Stand to Sit: with supervision Pt will Ambulate: >150 feet;with supervision;with least restrictive assistive device Pt will Go Up / Down Stairs: 3-5 stairs;with min assist  PT Evaluation Precautions/Restrictions  Precautions Precautions: Fall Restrictions Weight Bearing Restrictions: No Prior Functioning  Home Living Lives With: Spouse Receives Help From: Family Type of Home: House Home Layout: One level Home Access: Stairs to enter Entrance Stairs-Rails: None Entrance Stairs-Number of Steps: 3 Home Adaptive Equipment: Walker - rolling (need to corroborate that pt has RW) Prior Function Level of Independence: Requires assistive device for independence Comments: Need more info re: PLOF Cognition Cognition Arousal/Alertness: Awake/alert Orientation Level: Oriented to person;Oriented to place Cognition - Other Comments: pt with h/o dementia; very pleasant, following all commands Sensation/Coordination Coordination Gross Motor Movements are Fluid and Coordinated: Yes Fine Motor Movements are Fluid and Coordinated: Not tested Extremity Assessment RUE Assessment RUE Assessment: Within  Functional Limits LUE Assessment LUE Assessment: Within Functional Limits RLE Assessment RLE Assessment: Exceptions  to [facility] RLE Strength RLE Overall Strength Comments: decr AROM and strength, requiring physical assist for sit to stand LLE Assessment LLE Assessment: Exceptions to [facility] LLE Strength LLE Overall Strength Comments: decr Strength, requiring phisical assist for sit to stand Mobility (including Balance) Bed Mobility Bed Mobility: Yes Supine to Sit: 3: Mod assist;HOB flat Supine to Sit Details (indicate cue type and reason): cues to initiate; physical assist for scooting to  EOB Transfers Transfers: Yes Sit to Stand: 3: Mod assist;From bed (2 reps) Sit to Stand Details (indicate cue type and reason): cues to initiate and lean anteriorly Stand to Sit: 4: Min assist;To chair/3-in-1;To bed Stand to Sit Details: cues to control descent Ambulation/Gait Ambulation/Gait: Yes Ambulation/Gait Assistance: 3: Mod assist Ambulation Distance (Feet): 15 Feet Assistive device: 2 person hand held assist Gait Pattern: Decreased step length - right;Decreased step length - left;Shuffle (heavily dependent on UE support) Gait velocity: decr Stairs: No Wheelchair Mobility Wheelchair Mobility: No       End of Session PT - End of Session Equipment Utilized During Treatment: Gait belt Activity Tolerance: Patient tolerated treatment well Patient left: in chair;with bed alarm set;with call bell in reach Nurse Communication: Mobility status for transfers General Behavior During Session: [facility] for tasks performed (simple mobility tasks) Cognition: Impaired, at baseline  Olen Pel [place], [place] [phone]  01/04/2011, 5:25 PM

## 2011-01-05 ENCOUNTER — Ambulatory Visit (HOSPITAL_COMMUNITY): Payer: Medicare Other

## 2011-01-05 LAB — GLUCOSE, CAPILLARY
Glucose-Capillary: 125 mg/dL — ABNORMAL HIGH (ref 70–99)
Glucose-Capillary: 188 mg/dL — ABNORMAL HIGH (ref 70–99)

## 2011-01-05 MED ORDER — OSELTAMIVIR PHOSPHATE 75 MG PO CAPS: 75.0000 mg | ORAL_CAPSULE | Freq: Two times a day (BID) | ORAL | Status: AC

## 2011-01-05 MED ORDER — IPRATROPIUM BROMIDE HFA 17 MCG/ACT IN AERS: 2.0000 | INHALATION_SPRAY | Freq: Four times a day (QID) | RESPIRATORY_TRACT | Status: DC

## 2011-01-05 MED ORDER — MOXIFLOXACIN HCL 400 MG PO TABS: 400.0000 mg | ORAL_TABLET | Freq: Every day | ORAL | Status: AC

## 2011-01-05 NOTE — Progress Notes (Signed)
Pt. Discharged to home per MD order. Pt. Discharge instructions reviewed with wife and all medications/new prescriptions. Pt. Had no complaints of pain, or dizziness or shortness of breath upon discharge.  Efraim Kaufmann

## 2011-01-05 NOTE — ED Provider Notes (Signed)
History     CSN: [id number]  Arrival date & time 01/03/11  0908   First MD Initiated Contact with Patient 01/03/11 0914      Chief Complaint  Patient presents with  . Seizures    (Consider location/radiation/quality/duration/timing/severity/associated sxs/prior treatment) HPI Pt presents to the emergency department with fever and cough for the last 3 days.  Moist states this morning that he was sweating and shaking and not as responsive.  He states that his troponin did seem to respond but was slow in doing so.  States the patient never had a seizure.  Patient has a history of dementia so most the history is provided by his wife.  Patient denies chest pain, shortness of breath, vomiting, nausea, or diarrhea.  Patient has no rashes noted.  Past Medical History  Diagnosis Date  . Flashers or floaters, right eye   . Hyperlipidemia   . DJD of shoulder   . Aortic stenosis     severe sees Dr. Peter [place]  . Mitral insufficiency   . RBBB (right bundle branch block)   . LAFB (left anterior fascicular block)   . Shingles   . Diabetes mellitus   . Carotid artery disease     last dopplers on 05/23/09 were stable, LICA with 0-39% stenosis and RICA with 40-59%  . Acute arterial ischemic stroke, vertebrobasilar, thalamic 01/08  . Pneumonia 04/11    Past Surgical History  Procedure Date  . Tonsillectomy   . Colonoscopy 11/04    clear no repeats needed  . Aortic valve replacement     used bovine valve in 2000 per Dr. Laneta Simmers  . Carotid endarterectomy 01/13/08    left per Dr. Hart [place]    History reviewed. No pertinent family history.  History  Substance Use Topics  . Smoking status: Former Games developer  . Smokeless tobacco: Never Used  . Alcohol Use: No      Review of Systems All pertinent positives and negatives reviewed in the history of present illness  Allergies  Penicillins  Home Medications  No current outpatient prescriptions on file.  BP 137/77  Pulse 93  Temp(Src)  97.6 F (36.4 C) (Oral)  Resp 18  Ht 6' (1.829 m)  Wt 160 lb 7.9 oz (72.8 kg)  BMI 21.77 kg/m2  SpO2 95%  Physical Exam  Constitutional: He appears well-developed and well-nourished. He appears distressed.  HENT:  Head: Normocephalic and atraumatic.  Mouth/Throat: Oropharyngeal exudate present.  Eyes: Pupils are equal, round, and reactive to light.  Cardiovascular: Normal rate, regular rhythm and normal heart sounds.   Pulmonary/Chest: Effort normal. He has wheezes. He has no rales.  Abdominal: Soft. Bowel sounds are normal. He exhibits no distension. There is no tenderness.  Neurological: He is alert.  Skin: Skin is warm and dry. No rash noted. No erythema.    ED Course  Procedures (including critical care time)  Labs Reviewed  CBC - Abnormal; Notable for the following:    RBC 4.20 (*)    Hemoglobin 12.8 (*)    HCT 37.3 (*)    Platelets 141 (*)    All other components within normal limits  DIFFERENTIAL - Abnormal; Notable for the following:    Lymphocytes Relative 11 (*)    All other components within normal limits  BASIC METABOLIC PANEL - Abnormal; Notable for the following:    Glucose, Bld 224 (*)    GFR calc non Af Amer 79 (*)    All other components within normal limits  URINALYSIS, ROUTINE W REFLEX MICROSCOPIC - Abnormal; Notable for the following:    Glucose, UA 100 (*)    Protein, ur 30 (*)    All other components within normal limits  GLUCOSE, CAPILLARY - Abnormal; Notable for the following:    Glucose-Capillary 197 (*)    All other components within normal limits  URINE MICROSCOPIC-ADD ON - Abnormal; Notable for the following:    Squamous Epithelial / LPF FEW (*)    All other components within normal limits  INFLUENZA PANEL BY PCR - Abnormal; Notable for the following:    Influenza A By PCR POSITIVE (*)    All other components within normal limits  COMPREHENSIVE METABOLIC PANEL - Abnormal; Notable for the following:    Sodium 134 (*)    Glucose, Bld 231  (*)    Albumin 3.3 (*)    GFR calc non Af Amer 77 (*)    GFR calc Af Amer 90 (*)    All other components within normal limits  TSH - Abnormal; Notable for the following:    TSH 0.223 (*)    All other components within normal limits  HEMOGLOBIN A1C - Abnormal; Notable for the following:    Hemoglobin A1C 7.1 (*)    Mean Plasma Glucose 157 (*)    All other components within normal limits  HEMOGLOBIN A1C - Abnormal; Notable for the following:    Hemoglobin A1C 7.2 (*)    Mean Plasma Glucose 160 (*)    All other components within normal limits  BASIC METABOLIC PANEL - Abnormal; Notable for the following:    Glucose, Bld 121 (*)    GFR calc non Af Amer 78 (*)    All other components within normal limits  CBC - Abnormal; Notable for the following:    Hemoglobin 12.8 (*)    HCT 38.1 (*)    Platelets 141 (*)    All other components within normal limits  GLUCOSE, CAPILLARY - Abnormal; Notable for the following:    Glucose-Capillary 180 (*)    All other components within normal limits  GLUCOSE, CAPILLARY - Abnormal; Notable for the following:    Glucose-Capillary 197 (*)    All other components within normal limits  GLUCOSE, CAPILLARY - Abnormal; Notable for the following:    Glucose-Capillary 143 (*)    All other components within normal limits  GLUCOSE, CAPILLARY - Abnormal; Notable for the following:    Glucose-Capillary 142 (*)    All other components within normal limits  GLUCOSE, CAPILLARY - Abnormal; Notable for the following:    Glucose-Capillary 160 (*)    All other components within normal limits  GLUCOSE, CAPILLARY - Abnormal; Notable for the following:    Glucose-Capillary 125 (*)    All other components within normal limits  GLUCOSE, CAPILLARY - Abnormal; Notable for the following:    Glucose-Capillary 188 (*)    All other components within normal limits  GLUCOSE, CAPILLARY - Abnormal; Notable for the following:    Glucose-Capillary 125 (*)    All other components  within normal limits  MAGNESIUM  PHOSPHORUS  APTT  PROTIME-INR  CARDIAC PANEL(CRET KIN+CKTOT+MB+TROPI)  CARDIAC PANEL(CRET KIN+CKTOT+MB+TROPI)  CARDIAC PANEL(CRET KIN+CKTOT+MB+TROPI)  POCT CBG MONITORING  URINALYSIS, ROUTINE W REFLEX MICROSCOPIC  POCT CBG MONITORING  POCT CBG MONITORING   Mr Brain Wo Contrast  01/03/2011  *RADIOLOGY REPORT*  Clinical Data: Seizures.  Generalized weakness.  MRI HEAD WITHOUT CONTRAST  Technique:  Multiplanar, multiecho pulse sequences of the brain and surrounding structures  were obtained according to standard protocol without intravenous contrast.  Comparison: CT head without contrast 01/03/2011.  The  Findings: There is some artifact from dental work anteriorly. Moderate generalized atrophy is present.  The diffusion weighted images demonstrate no evidence for acute or subacute infarction. Marked vermian atrophy is present.  There is disproportionate cerebellar atrophy as well.  Remote lacunar infarcts are present in the cerebellum bilaterally.  Moderate generalized cerebral atrophy is present bilaterally.  A remote lacunar infarct of the left thalamus is again noted.  Abnormal flow signal is again noted within the left vertebral artery, likely representing chronic occlusion.  Flow is otherwise present within the major intracranial arteries.  The patient is status post bilateral lens extractions.  Mild mucosal thickening is evident throughout the ethmoid air cells and within the left maxillary sinus.  Minimal fluid is present in the left mastoid air cells.  IMPRESSION:  1.  No acute or focal intracranial abnormality to explain seizures or new neurologic changes. 2.  Stable atrophy and diffuse white matter disease. 3.  Disproportionate atrophy of the cerebellum and in particular vermian atrophy as previously seen. 4.  Abnormal signal in the distal left vertebral artery compatible with chronic occlusion is previously noted.  Original Report Authenticated By: Jamesetta Orleans. MATTERN, M.D.     1. Near syncope   2. Bronchitis   3. Diabetes mellitus       MDM  The patient is admitted to the hospital         Carlyle Dolly, PA-C 01/12/11 [phone]

## 2011-01-05 NOTE — Progress Notes (Signed)
Occupational Therapy Evaluation Patient Details Name: Christian [name] MRN: [id number] DOB: 02/12/1921 Today's Date: 01/05/2011  Problem List:  Patient Active Problem List  Diagnoses  . DIABETES MELLITUS, TYPE II  . HYPERTENSION, ESSENTIAL NOS  . VALVULAR HEART DISEASE  . CAROTID ARTERY DISEASE  . CEREBROVASCULAR ACCIDENT  . PNEUMONIA  . PULMONARY NODULE  . BENIGN PROSTATIC HYPERTROPHY, WITH OBSTRUCTION  . DIZZINESS  . ARM NUMBNESS  . Syncope  . Generalized weakness  . Pneumonia and influenza    Past Medical History:  Past Medical History  Diagnosis Date  . Flashers or floaters, right eye   . Hyperlipidemia   . DJD of shoulder   . Aortic stenosis     severe sees Dr. Peter [place]  . Mitral insufficiency   . RBBB (right bundle branch block)   . LAFB (left anterior fascicular block)   . Shingles   . Diabetes mellitus   . Carotid artery disease     last dopplers on 05/23/09 were stable, LICA with 0-39% stenosis and RICA with 40-59%  . Acute arterial ischemic stroke, vertebrobasilar, thalamic 01/08  . Pneumonia 04/11   Past Surgical History:  Past Surgical History  Procedure Date  . Tonsillectomy   . Colonoscopy 11/04    clear no repeats needed  . Aortic valve replacement     used bovine valve in 2000 per Dr. Laneta Simmers  . Carotid endarterectomy 01/13/08    left per Dr. Hart [place]    OT Assessment/Plan/Recommendation OT Assessment Clinical Impression Statement: This 77 y.o. male presents to OT with generalized weakness, and increased dyspnea with activity.  Wife reports that she wants pt. to discharge to home, and son can assist at discharge.  Recommend OT to maximize safety and independence with BADLs to allow pt. to return home with min A from family.  Recommend HHOT and aide OT Recommendation/Assessment: Patient will need skilled OT in the acute care venue OT Problem List: Decreased strength;Decreased activity tolerance;Impaired balance (sitting and/or standing);Decreased  safety awareness;Cardiopulmonary status limiting activity Barriers to Discharge: None OT Therapy Diagnosis : Generalized weakness OT Plan OT Frequency: Min 2X/week OT Treatment/Interventions: Self-care/ADL training;DME and/or AE instruction;Therapeutic activities;Patient/family education;Balance training OT Recommendation Follow Up Recommendations: Home health OT;Other (comment) (And HHaide) Equipment Recommended: 3 in 1 bedside comode;Rolling walker with 5" wheels Individuals Consulted Consulted and Agree with Results and Recommendations: Patient;Family member/caregiver OT Goals Acute Rehab OT Goals OT Goal Formulation: With patient/family Time For Goal Achievement: 2 weeks ADL Goals Pt Will Perform Grooming: with min assist;Standing at sink ADL Goal: Grooming - Progress: Not met Pt Will Perform Upper Body Dressing: with set-up;Unsupported;Sitting, bed;Sitting, chair ADL Goal: Upper Body Dressing - Progress: Not met Pt Will Perform Lower Body Dressing: with min assist;Sit to stand from chair;Sit to stand from bed ADL Goal: Lower Body Dressing - Progress: Not met Pt Will Transfer to Toilet: with min assist;Ambulation;Raised toilet seat with arms ADL Goal: Toilet Transfer - Progress: Not met Pt Will Perform Toileting - Clothing Manipulation: with min assist;Standing (min guard assist) ADL Goal: Toileting - Clothing Manipulation - Progress: Not met Pt Will Perform Toileting - Hygiene: with supervision;Sitting on 3-in-1 or toilet ADL Goal: Toileting - Hygiene - Progress: Not met Pt Will Perform Tub/Shower Transfer: with min assist ADL Goal: Tub/Shower Transfer - Progress: Not met  OT Evaluation Precautions/Restrictions  Precautions Precautions: Fall Restrictions Weight Bearing Restrictions: No Prior Functioning Home Living Lives With: Spouse Receives Help From: Family Type of Home: House Home Layout: One level Home Access: Stairs  to enter Entrance Stairs-Rails: None Entrance  Stairs-Number of Steps: 3 Bathroom Shower/Tub: Forensic scientist: Standard Bathroom Accessibility: Yes How Accessible: Accessible via walker Home Adaptive Equipment: Walker - rolling;Shower chair with back;Grab bars in shower (Pt. uses RW intermittently) Prior Function Level of Independence: Requires assistive device for independence;Needs assistance with ADLs;Independent with gait Bath: Moderate Able to Take Stairs?: Yes Driving: No Vocation: Retired Comments: Son also lives with pt. and wife and can assist at discharge ADL ADL Eating/Feeding: Performed;Independent Where Assessed - Eating/Feeding: Chair Grooming: Simulated;Denture care;Brushing hair;Minimal assistance Where Assessed - Grooming: Sitting, chair;Supported Location manager Bathing: Simulated;Minimal assistance Where Assessed - Upper Body Bathing: Supported;Sitting, chair Lower Body Bathing: Simulated;Moderate assistance Where Assessed - Lower Body Bathing: Sit to stand from bed Upper Body Dressing: Simulated;Moderate assistance Where Assessed - Upper Body Dressing: Unsupported;Sitting, bed Lower Body Dressing: Simulated;Maximal assistance (Pt. with dyspnea 3/4 with donning/doffing socks) Where Assessed - Lower Body Dressing: Sit to stand from bed Toilet Transfer: Simulated;Minimal assistance Toilet Transfer Method: Stand pivot Toilet Transfer Equipment: Bedside commode Toileting - Clothing Manipulation: Simulated;Moderate assistance Where Assessed - Toileting Clothing Manipulation: Standing Toileting - Hygiene: Simulated;Moderate assistance Where Assessed - Toileting Hygiene: Standing ADL Comments: Pt. moved supine to EOB with min guard assist.  Pt. sat EOB with min guard assist for static sitting, and min assist for dynamic sitting.  Pt. fatigues rapidly with simple ADL tasks.  Required rest break after donning/doffing socks.   Vision/Perception  Vision - History Baseline Vision: Wears glasses all  the time Cognition Cognition Arousal/Alertness: Awake/alert Overall Cognitive Status: History of cognitive impairments History of Cognitive Impairment: Appears at baseline functioning Orientation Level: Oriented to person;Oriented to place;Disoriented to time Cognition - Other Comments: pt with h/o dementia; very pleasant, following all commands Sensation/Coordination Coordination Gross Motor Movements are Fluid and Coordinated: Yes Fine Motor Movements are Fluid and Coordinated: Yes Extremity Assessment RUE Assessment RUE Assessment: Within Functional Limits LUE Assessment LUE Assessment: Within Functional Limits Mobility  Bed Mobility Bed Mobility: Yes Supine to Sit: 4: Min assist (min guard assist) Transfers Transfers: Yes Sit to Stand: 4: Min assist;With upper extremity assist Sit to Stand Details (indicate cue type and reason): Pt with posterior bias.  Maintains hips flexed.  With facilitation, pt. able to extend hips to achieve upright posture (Pt. with shuffling steps to chair) Stand to Sit: 4: Min assist;To chair/3-in-1;To bed Exercises   End of Session OT - End of Session Activity Tolerance: Patient limited by fatigue Patient left: in chair;with call bell in reach;with family/visitor present General Behavior During Session: [facility] for tasks performed Cognition: Impaired, at baseline   Comer Devins, Ursula Alert M 01/05/2011, 1:12 PM

## 2011-01-05 NOTE — Progress Notes (Signed)
Physical Therapy Treatment Patient Details Name: Christian [name] MRN: [id number] DOB: 1921-02-18 Today's Date: 01/05/2011  PT Assessment/Plan  PT - Assessment/Plan Comments on Treatment Session: Pt progressing daily, pt is increasing LE strength and stability with increased ambulation distances. Educated pt, his wife and nurse to ambulate pt to and from the bathroom as well as use LEs as often as possible to increase strength PT Plan: Discharge plan remains appropriate PT Frequency: Min 3X/week Follow Up Recommendations: Home health PT;24 hour supervision/assistance Equipment Recommended: 3 in 1 bedside comode;Rolling walker with 5" wheels PT Goals  Acute Rehab PT Goals PT Goal Formulation: Patient unable to participate in goal setting PT Goal: Supine/Side to Sit - Progress: Not met PT Goal: Sit to Supine/Side - Progress: Not met PT Goal: Sit to Stand - Progress: Progressing toward goal PT Goal: Stand to Sit - Progress: Progressing toward goal PT Goal: Ambulate - Progress: Progressing toward goal PT Goal: Up/Down Stairs - Progress: Not met  PT Treatment Precautions/Restrictions  Precautions Precautions: Fall Restrictions Weight Bearing Restrictions: No Mobility (including Balance) Bed Mobility Bed Mobility: No Supine to Sit: 4: Min assist (min guard assist) Transfers Transfers: Yes Sit to Stand: 4: Min assist;With upper extremity assist;From chair/3-in-1 Sit to Stand Details (indicate cue type and reason): VC for hand placement and proper technique to assist with standing with anterior lean Stand to Sit: 4: Min assist;To chair/3-in-1 Stand to Sit Details: VC for hand placement for safety. Pt with controlled descent and anterior lean without cueing Ambulation/Gait Ambulation/Gait: Yes Ambulation/Gait Assistance: 4: Min assist Ambulation/Gait Assistance Details (indicate cue type and reason): Continuous cues for safety with distance to RW. Pt ambulates with RW far in front of him  without support for balance. Pt with minimal unsteadiness during ambulation Ambulation Distance (Feet): 30 Feet Assistive device: Rolling walker Gait Pattern: Decreased step length - right;Decreased step length - left;Shuffle;Trunk flexed Gait velocity: Decreased gait speed Stairs: No    Exercise  General Exercises - Lower Extremity Long Arc Quad: AROM;Strengthening;Both;10 reps;Seated Hip Flexion/Marching: Seated;10 reps;Strengthening;AROM;Both End of Session PT - End of Session Equipment Utilized During Treatment: Gait belt Activity Tolerance: Patient tolerated treatment well Patient left: in chair;with call bell in reach;with family/visitor present Nurse Communication: Mobility status for transfers General Behavior During Session: [facility] for tasks performed Cognition: Impaired, at baseline (dementia)  Milana Kidney 01/05/2011, 3:01 PM  01/05/2011 Milana Kidney DPT PAGER: [phone] OFFICE: [phone]

## 2011-01-05 NOTE — Discharge Summary (Signed)
Patient ID: Christian [name] MRN: [id number] DOB/AGE: 1921-10-06 77 y.o.  Admit date: 01/03/2011 Discharge date: 01/05/2011  Primary Care Physician:  [name], MD  Discharge Diagnoses:    Present on Admission:  .Syncope .Generalized weakness .Pneumonia and influenza .DIABETES MELLITUS, TYPE II  Principal Problem:  *Syncope Active Problems:  Generalized weakness  Pneumonia and influenza  DIABETES MELLITUS, TYPE II   Current Discharge Medication List    START taking these medications   Details  ipratropium (ATROVENT HFA) 17 MCG/ACT inhaler Inhale 2 puffs into the lungs every 6 (six) hours. Qty: 1 Inhaler, Refills: 12    moxifloxacin (AVELOX) 400 MG tablet Take 1 tablet (400 mg total) by mouth daily. Qty: 10 tablet, Refills: 0      CONTINUE these medications which have NOT CHANGED   Details  aspirin 325 MG tablet Take 325 mg by mouth daily.      glyBURIDE (DIABETA) 5 MG tablet Take 1 tablet (5 mg total) by mouth 2 (two) times daily with a meal. Qty: 60 tablet, Refills: 11   Associated Diagnoses: Diabetes mellitus    lisinopril (PRINIVIL,ZESTRIL) 20 MG tablet TAKE 1 TABLET EVERY DAY Qty: 30 tablet, Refills: 6    metFORMIN (GLUCOPHAGE) 1000 MG tablet Take 1 tablet (1,000 mg total) by mouth 2 (two) times daily with a meal. Qty: 60 tablet, Refills: 11   Associated Diagnoses: Diabetes mellitus    multivitamin-lutein (OCUVITE-LUTEIN) CAPS Take 1 capsule by mouth daily.      simvastatin (ZOCOR) 40 MG tablet TAKE 1 TABLET AT BEDTIME Qty: 30 tablet, Refills: 3    sitaGLIPtan (JANUVIA) 100 MG tablet Take 1 tablet (100 mg total) by mouth daily. Qty: 30 tablet, Refills: 11   Associated Diagnoses: Diabetes mellitus    meclizine (ANTIVERT) 25 MG tablet Take 25 mg by mouth as needed. FOR DIZZINESS    ONE TOUCH ULTRA TEST test strip USE ONE STRIP TO CHECK GLUCOSE EVERY DAY Qty: 100 each, Refills: 2        Disposition and Follow-up: with PCP in 1 week  Consults:   none  Significant Diagnostic Studies:  Dg Chest 2 View  01/03/2011  *RADIOLOGY REPORT*  Clinical Data: Fever and cough.  CHEST - 2 VIEW  Comparison: 04/08/2009  Findings: There is slight peribronchial thickening consistent with bronchitis.  The interstitial markings are slightly accentuated at the bases but there are no consolidative infiltrates or effusions. Heart size and vascularity are normal.  Evidence of prior aortic valve replacement.  No acute osseous abnormality.  IMPRESSION: Bronchitic changes.  Original Report Authenticated By: Gwynn Burly, M.D.   Ct Head Wo Contrast  01/03/2011  *RADIOLOGY REPORT*  Clinical Data: Seizure-like activity this morning.  Lethargic.  CT HEAD WITHOUT CONTRAST  Technique:  Contiguous axial images were obtained from the base of the skull through the vertex without contrast.  Comparison: Head CT 04/08/2009  Findings: Stable pattern of chronic microvascular ischemic changes bilaterally.  Stable ventricular size and cortical atrophy. Negative for hemorrhage, hydrocephalus, mass effect, mass lesion, or evidence of acute cortically based infarction.  Atherosclerotic calcification of the intracranial vasculature.  Mild mucosal thickening of some of the ethmoid air cells. Otherwise, the sinuses are clear.  Mastoid air cells are clear. The skull is intact.  IMPRESSION:  1.  No acute intracranial abnormality. 2.  Extensive chronic microvascular ischemic changes. 3.  Mild chronic ethmoid sinusitis.  Original Report Authenticated By: Britta Mccreedy, M.D.   Mr Brain Wo Contrast  01/03/2011  *RADIOLOGY REPORT*  Clinical Data: Seizures.  Generalized weakness.  MRI HEAD WITHOUT CONTRAST  Technique:  Multiplanar, multiecho pulse sequences of the brain and surrounding structures were obtained according to standard protocol without intravenous contrast.  Comparison: CT head without contrast 01/03/2011.  The  Findings: There is some artifact from dental work anteriorly. Moderate  generalized atrophy is present.  The diffusion weighted images demonstrate no evidence for acute or subacute infarction. Marked vermian atrophy is present.  There is disproportionate cerebellar atrophy as well.  Remote lacunar infarcts are present in the cerebellum bilaterally.  Moderate generalized cerebral atrophy is present bilaterally.  A remote lacunar infarct of the left thalamus is again noted.  Abnormal flow signal is again noted within the left vertebral artery, likely representing chronic occlusion.  Flow is otherwise present within the major intracranial arteries.  The patient is status post bilateral lens extractions.  Mild mucosal thickening is evident throughout the ethmoid air cells and within the left maxillary sinus.  Minimal fluid is present in the left mastoid air cells.  IMPRESSION:  1.  No acute or focal intracranial abnormality to explain seizures or new neurologic changes. 2.  Stable atrophy and diffuse white matter disease. 3.  Disproportionate atrophy of the cerebellum and in particular vermian atrophy as previously seen. 4.  Abnormal signal in the distal left vertebral artery compatible with chronic occlusion is previously noted.  Original Report Authenticated By: Jamesetta Orleans. MATTERN, M.D.    Brief H and P: 77 year old male with history of dementia, DM, HTN, Dyslipidemia Who presented to ED with complaints of upper respiratory symptoms and generalized weakanes over last few days prior to admission. As per patient's wife, patient has been feeling weak and had subjective fevers, some cough productive of clear sputum. Patient's wife reports patient had no complaints of chest pain, no shortness of breath, no nausea or vomiting, no abdominal pain, no blood in stool or urine. Patient has underlying dementia and is not a good historian so the information was provided by wife and ED physician.   Physical Exam on Discharge:  Filed Vitals:   01/05/11 0513 01/05/11 0800 01/05/11 1052  01/05/11 1200  BP: 176/72 147/73 145/78 137/77  Pulse: 96 95  93  Temp: 97.6 F (36.4 C) 97.1 F (36.2 C)  97.6 F (36.4 C)  TempSrc: Oral     Resp: 20 20  18   Height:      Weight: 72.8 kg (160 lb 7.9 oz)     SpO2: 95% 95%  95%     Intake/Output Summary (Last 24 hours) at 01/05/11 1455 Last data filed at 01/05/11 0824  Gross per 24 hour  Intake 3296.25 ml  Output   2325 ml  Net 971.25 ml    General: Alert, awake, oriented x3, in no acute distress. HEENT: No bruits, no goiter. Heart: Regular rate and rhythm, without murmurs, rubs, gallops. Lungs: Clear to auscultation bilaterally. Abdomen: Soft, nontender, nondistended, positive bowel sounds. Extremities: No clubbing cyanosis or edema with positive pedal pulses. Neuro: Grossly intact, nonfocal.  CBC:    Component Value Date/Time   WBC 6.1 01/04/2011 0010   HGB 12.8* 01/04/2011 0010   HCT 38.1* 01/04/2011 0010   PLT 141* 01/04/2011 0010   MCV 89.0 01/04/2011 0010   NEUTROABS 6.4 01/03/2011 0937   LYMPHSABS 0.9 01/03/2011 0937   MONOABS 1.0 01/03/2011 0937   EOSABS 0.0 01/03/2011 0937   BASOSABS 0.0 01/03/2011 9604    Basic Metabolic Panel:    Component Value Date/Time  NA 139 01/04/2011 0010   K 3.9 01/04/2011 0010   CL 104 01/04/2011 0010   CO2 25 01/04/2011 0010   BUN 14 01/04/2011 0010   CREATININE 0.80 01/04/2011 0010   GLUCOSE 121* 01/04/2011 0010   GLUCOSE 135* 11/02/2005 1101   CALCIUM 9.1 01/04/2011 0010    Assessment/Plan:   Principal Problem:   *SYNCOPE  - unclear etiology  - resolved at present  - MRI brain is normal, follow up EEG results normal  Active Problems:   GENERALIZED WEAKNESS  - likely secondary to dehydration versus infection (upper respiratory) pneumonia / influenza   PNEUMONIA AND INFLUENZA  - Influenza A positive by PCR, started tamiflu 01/04/11  - continue avalox and oseltamivir on discharge  DIABETES MELLITUS  - Continue home medications  EDUCATION  - test  results and diagnostic studies were discussed with patient and pt's family who was present at the bedside  - patient and family have verbalized the understanding  - questions were answered at the bedside and contact information was provided for additional questions or concerns   Time spent on Discharge: Greater than 30 minutes  Signed: Triston Lisanti 01/05/2011, 2:55 PM

## 2011-01-06 ENCOUNTER — Ambulatory Visit (HOSPITAL_COMMUNITY): Payer: Medicare Other

## 2011-01-13 NOTE — ED Provider Notes (Signed)
Medical screening examination/treatment/procedure(s) were performed by non-physician practitioner and as supervising physician I was immediately available for consultation/collaboration.   Sybol Morre A. Teresa Lemmerman, MD 01/13/11 1235 

## 2011-03-03 ENCOUNTER — Other Ambulatory Visit: Payer: Self-pay | Admitting: Family Medicine

## 2011-04-15 ENCOUNTER — Other Ambulatory Visit: Payer: Self-pay | Admitting: Cardiology

## 2011-04-15 MED ORDER — SIMVASTATIN 40 MG PO TABS: 40.0000 mg | ORAL_TABLET | Freq: Every day | ORAL | Status: DC

## 2011-05-11 ENCOUNTER — Other Ambulatory Visit: Payer: Self-pay | Admitting: Family Medicine

## 2011-05-26 ENCOUNTER — Other Ambulatory Visit: Payer: Self-pay | Admitting: *Deleted

## 2011-05-26 DIAGNOSIS — I6529 Occlusion and stenosis of unspecified carotid artery: Secondary | ICD-10-CM

## 2011-05-29 ENCOUNTER — Encounter (INDEPENDENT_AMBULATORY_CARE_PROVIDER_SITE_OTHER): Payer: Medicare Other

## 2011-05-29 DIAGNOSIS — I6529 Occlusion and stenosis of unspecified carotid artery: Secondary | ICD-10-CM

## 2011-06-03 ENCOUNTER — Other Ambulatory Visit: Payer: Self-pay | Admitting: Family Medicine

## 2011-06-03 NOTE — Progress Notes (Signed)
Quick Note:  I spoke with pt ______ 

## 2011-06-13 ENCOUNTER — Other Ambulatory Visit: Payer: Self-pay | Admitting: Family Medicine

## 2011-09-03 ENCOUNTER — Encounter: Payer: Self-pay | Admitting: Family Medicine

## 2011-09-24 ENCOUNTER — Other Ambulatory Visit: Payer: Self-pay | Admitting: Family Medicine

## 2011-11-09 ENCOUNTER — Other Ambulatory Visit: Payer: Self-pay

## 2011-11-09 MED ORDER — SIMVASTATIN 40 MG PO TABS: 40.0000 mg | ORAL_TABLET | Freq: Every day | ORAL | Status: DC

## 2011-12-03 ENCOUNTER — Other Ambulatory Visit: Payer: Self-pay | Admitting: Family Medicine

## 2011-12-18 ENCOUNTER — Other Ambulatory Visit: Payer: Self-pay | Admitting: Family Medicine

## 2011-12-24 ENCOUNTER — Ambulatory Visit (INDEPENDENT_AMBULATORY_CARE_PROVIDER_SITE_OTHER): Payer: Medicare Other | Admitting: Family Medicine

## 2011-12-24 ENCOUNTER — Encounter: Payer: Self-pay | Admitting: Family Medicine

## 2011-12-24 VITALS — BP 168/70 | HR 98 | Temp 97.8°F | Ht 72.0 in | Wt 169.0 lb

## 2011-12-24 DIAGNOSIS — Z0000 Encounter for general adult medical examination without abnormal findings: Secondary | ICD-10-CM

## 2011-12-24 DIAGNOSIS — F0390 Unspecified dementia without behavioral disturbance: Secondary | ICD-10-CM

## 2011-12-24 DIAGNOSIS — E119 Type 2 diabetes mellitus without complications: Secondary | ICD-10-CM

## 2011-12-24 LAB — HEMOGLOBIN A1C: Hgb A1c MFr Bld: 7.4 % — ABNORMAL HIGH (ref 4.6–6.5)

## 2011-12-24 LAB — POCT URINALYSIS DIPSTICK
Leukocytes, UA: NEGATIVE
Nitrite, UA: NEGATIVE
Urobilinogen, UA: 0.2

## 2011-12-24 LAB — TSH: TSH: 0.27 u[IU]/mL — ABNORMAL LOW (ref 0.35–5.50)

## 2011-12-24 LAB — BASIC METABOLIC PANEL
CO2: 28 mEq/L (ref 19–32)
Calcium: 9.4 mg/dL (ref 8.4–10.5)
Creatinine, Ser: 1.1 mg/dL (ref 0.4–1.5)
Glucose, Bld: 143 mg/dL — ABNORMAL HIGH (ref 70–99)

## 2011-12-24 LAB — CBC WITH DIFFERENTIAL/PLATELET
Basophils Absolute: 0 10*3/uL (ref 0.0–0.1)
Lymphocytes Relative: 27.3 % (ref 12.0–46.0)
Lymphs Abs: 2.3 10*3/uL (ref 0.7–4.0)
Monocytes Relative: 7.4 % (ref 3.0–12.0)
Platelets: 205 10*3/uL (ref 150.0–400.0)
RDW: 14.6 % (ref 11.5–14.6)

## 2011-12-24 LAB — LIPID PANEL
LDL Cholesterol: 42 mg/dL (ref 0–99)
Total CHOL/HDL Ratio: 3
VLDL: 26.2 mg/dL (ref 0.0–40.0)

## 2011-12-24 LAB — HEPATIC FUNCTION PANEL
ALT: 14 U/L (ref 0–53)
Total Bilirubin: 0.6 mg/dL (ref 0.3–1.2)
Total Protein: 6.9 g/dL (ref 6.0–8.3)

## 2011-12-24 MED ORDER — GLYBURIDE 5 MG PO TABS: 5.0000 mg | ORAL_TABLET | Freq: Two times a day (BID) | ORAL | Status: DC

## 2011-12-24 MED ORDER — LISINOPRIL 20 MG PO TABS: 20.0000 mg | ORAL_TABLET | Freq: Every day | ORAL | Status: DC

## 2011-12-24 MED ORDER — SITAGLIPTIN PHOSPHATE 100 MG PO TABS: 100.0000 mg | ORAL_TABLET | Freq: Every day | ORAL | Status: DC

## 2011-12-24 MED ORDER — SIMVASTATIN 40 MG PO TABS: 40.0000 mg | ORAL_TABLET | Freq: Every day | ORAL | Status: DC

## 2011-12-24 MED ORDER — IPRATROPIUM BROMIDE HFA 17 MCG/ACT IN AERS: 2.0000 | INHALATION_SPRAY | Freq: Four times a day (QID) | RESPIRATORY_TRACT | Status: DC

## 2011-12-24 MED ORDER — MECLIZINE HCL 25 MG PO TABS: 25.0000 mg | ORAL_TABLET | ORAL | Status: AC | PRN

## 2011-12-24 MED ORDER — DONEPEZIL HCL 5 MG PO TABS: 5.0000 mg | ORAL_TABLET | Freq: Every day | ORAL | Status: DC

## 2011-12-24 MED ORDER — METFORMIN HCL 1000 MG PO TABS: 1000.0000 mg | ORAL_TABLET | Freq: Two times a day (BID) | ORAL | Status: DC

## 2011-12-24 NOTE — Progress Notes (Signed)
  Subjective:    Patient ID: Rayan Ines Manton, male    DOB: [birth year], 78 y.o.   MRN: [id number]  HPI 78 yr old male with his wife for a cpx. He has been doing well physically but his memory has been eroding rapidly. No real personality changes, but his wife says he is very forgetful and requires assistance with all ADLs now.    Review of Systems  Constitutional: Negative.   HENT: Negative.   Eyes: Negative.   Respiratory: Negative.   Cardiovascular: Negative.   Gastrointestinal: Negative.   Genitourinary: Negative.   Musculoskeletal: Negative.   Skin: Negative.   Neurological: Negative.   Hematological: Negative.   Psychiatric/Behavioral: Negative.        Objective:   Physical Exam  Constitutional: He is oriented to person, place, and time. He appears well-developed and well-nourished. No distress.  HENT:  Head: Normocephalic and atraumatic.  Right Ear: External ear normal.  Left Ear: External ear normal.  Nose: Nose normal.  Mouth/Throat: Oropharynx is clear and moist. No oropharyngeal exudate.  Eyes: Conjunctivae normal and EOM are normal. Pupils are equal, round, and reactive to light. Right eye exhibits no discharge. Left eye exhibits no discharge. No scleral icterus.  Neck: Neck supple. No JVD present. No tracheal deviation present. No thyromegaly present.  Cardiovascular: Normal rate, regular rhythm, normal heart sounds and intact distal pulses.  Exam reveals no gallop and no friction rub.   No murmur heard.      EKG shows stable RBBB  Pulmonary/Chest: Effort normal and breath sounds normal. No respiratory distress. He has no wheezes. He has no rales. He exhibits no tenderness.  Abdominal: Soft. Bowel sounds are normal. He exhibits no distension and no mass. There is no tenderness. There is no rebound and no guarding.  Genitourinary: Rectum normal, prostate normal and penis normal. Guaiac negative stool. No penile tenderness.  Musculoskeletal: Normal range of motion. He  exhibits no edema and no tenderness.  Lymphadenopathy:    He has no cervical adenopathy.  Neurological: He is alert and oriented to person, place, and time. He has normal reflexes. No cranial nerve deficit. He exhibits normal muscle tone. Coordination normal.  Skin: Skin is warm and dry. No rash noted. He is not diaphoretic. No erythema. No pallor.  Psychiatric: He has a normal mood and affect. His behavior is normal. Judgment and thought content normal.          Assessment & Plan:  Well exam. Get fasting labs today. His dementia has progressed, so we will start on Aricept 5 mg daily. Recheck in one month, and we will plan on increasing the Aricept to the full 10 mg dose at that time.

## 2012-01-15 ENCOUNTER — Other Ambulatory Visit: Payer: Self-pay | Admitting: Family Medicine

## 2012-01-16 ENCOUNTER — Other Ambulatory Visit: Payer: Self-pay | Admitting: Family Medicine

## 2012-01-22 ENCOUNTER — Telehealth: Payer: Self-pay | Admitting: Family Medicine

## 2012-01-22 MED ORDER — IPRATROPIUM BROMIDE HFA 17 MCG/ACT IN AERS: 2.0000 | INHALATION_SPRAY | Freq: Four times a day (QID) | RESPIRATORY_TRACT | Status: DC | PRN

## 2012-01-22 NOTE — Addendum Note (Signed)
Addended by: Aniceto Boss A on: 01/22/2012 04:39 PM   Modules accepted: Orders

## 2012-01-22 NOTE — Telephone Encounter (Signed)
Okay call in #one with 11 rf

## 2012-01-22 NOTE — Telephone Encounter (Signed)
I sent script e-scribe to [facility]. 

## 2012-01-22 NOTE — Telephone Encounter (Signed)
Refill request from [facility] for Atrovent HFA Inhaler, pt was given this while in the hospital. Can we refill it?

## 2012-01-25 ENCOUNTER — Encounter: Payer: Self-pay | Admitting: Family Medicine

## 2012-01-25 ENCOUNTER — Ambulatory Visit (INDEPENDENT_AMBULATORY_CARE_PROVIDER_SITE_OTHER): Payer: Medicare Other | Admitting: Family Medicine

## 2012-01-25 VITALS — BP 154/60 | HR 106 | Temp 98.2°F | Wt 168.0 lb

## 2012-01-25 DIAGNOSIS — F0390 Unspecified dementia without behavioral disturbance: Secondary | ICD-10-CM

## 2012-01-25 MED ORDER — DONEPEZIL HCL 10 MG PO TABS: 10.0000 mg | ORAL_TABLET | Freq: Every evening | ORAL | Status: DC | PRN

## 2012-01-25 NOTE — Progress Notes (Signed)
  Subjective:    Patient ID: Christian [name], male    DOB: 10-15-1921, 79 y.o.   MRN: [id number]  HPI Here to follow up on dementia. For the past month he has taken Aricept 5 mg a day, and he has tolerated it well. His wife says she has not noticed much of a difference in his memory or behavior.   Review of Systems  Constitutional: Negative.   Neurological: Negative.   Psychiatric/Behavioral: Positive for confusion. Negative for behavioral problems, dysphoric mood and agitation.       Objective:   Physical Exam  Constitutional: He appears well-developed and well-nourished.  Neurological: He is alert.  Psychiatric: He has a normal mood and affect. His behavior is normal. Thought content normal.          Assessment & Plan:  We will increase the Aricept to 10 mg a day. Recheck in 90 days

## 2012-01-27 ENCOUNTER — Ambulatory Visit: Payer: Medicare Other | Admitting: Family Medicine

## 2012-01-28 ENCOUNTER — Other Ambulatory Visit: Payer: Self-pay | Admitting: Family Medicine

## 2012-01-28 NOTE — Telephone Encounter (Signed)
[facility] called the office seems to be some confusion over pts metformin refill. Pharmacy requesting a new script be sent to them

## 2012-01-29 ENCOUNTER — Telehealth: Payer: Self-pay | Admitting: Family Medicine

## 2012-01-29 NOTE — Telephone Encounter (Signed)
Refill request for metformin 1000 mg take 1 po bid.

## 2012-01-29 NOTE — Telephone Encounter (Signed)
His prescriptions are quite clear. We increased the Aricept to 10 mg a day, and he is on Metformin 1000 mg bid.

## 2012-02-02 NOTE — Telephone Encounter (Signed)
I left voice message for pharmacy, script was sent in 12/13 with 11 refills.

## 2012-03-05 IMAGING — CT CT HEAD W/O CM
1 of 2 series · 13 of 30 positions shown, 17 images · non-contrast
Comparison: 01/27/2007

CLINICAL DATA: Fever.  Weakness.  Blurred vision in the right
thigh.  History of CVA and diabetes.  Hypertension.

CT HEAD WITHOUT CONTRAST
TECHNIQUE: Contiguous axial images were obtained from the base of
the skull through the vertex without contrast.

[Series 2: brain · axial · 0.47mm/px · z∈[+151,+294]mm · 13 of 32 slices shown, 17 images]
[im 3/32  brain]
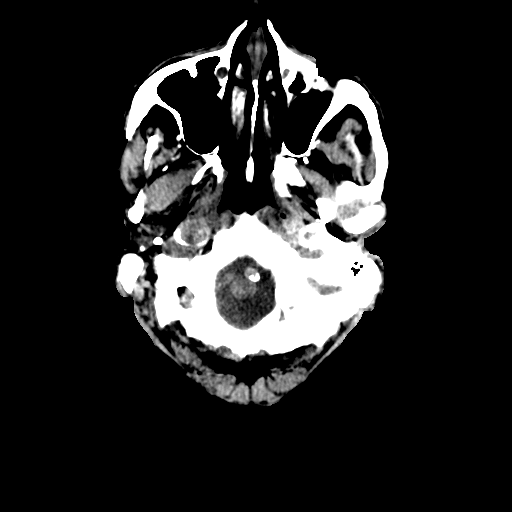
[im 3/32  bone]
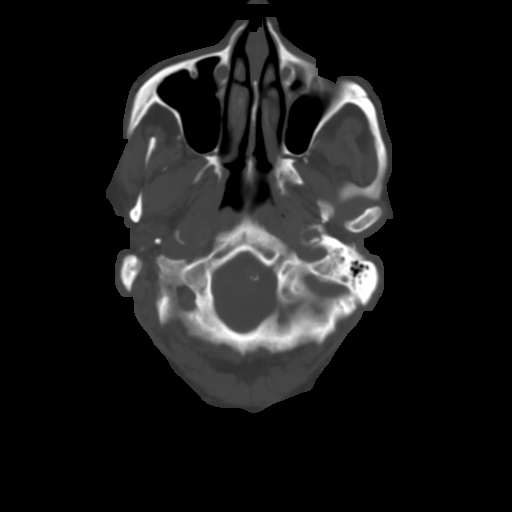
[im 5/32  brain]
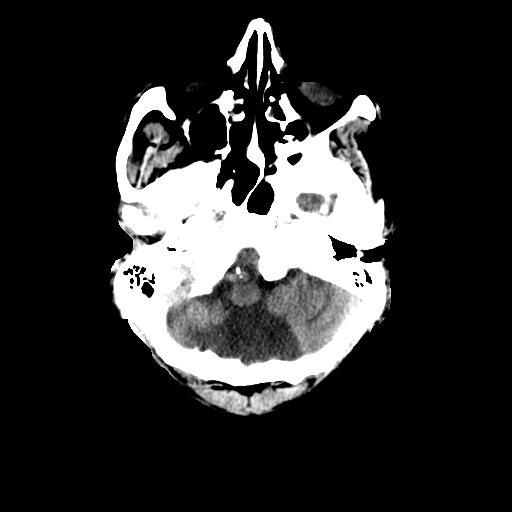
[im 7/32  brain]
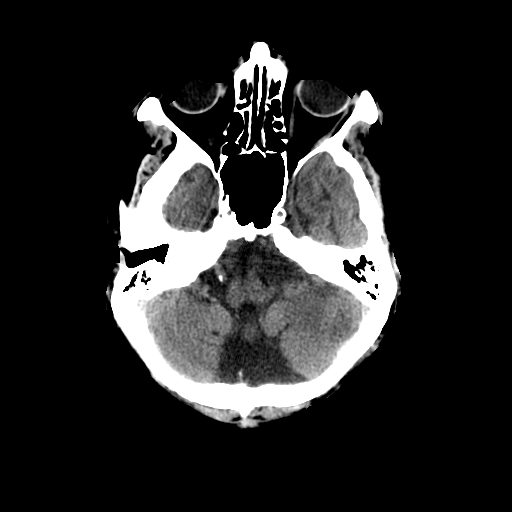
[im 9/32  brain]
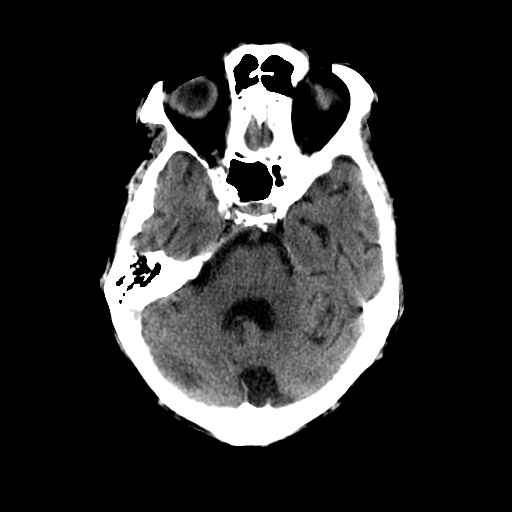
[im 12/32  brain]
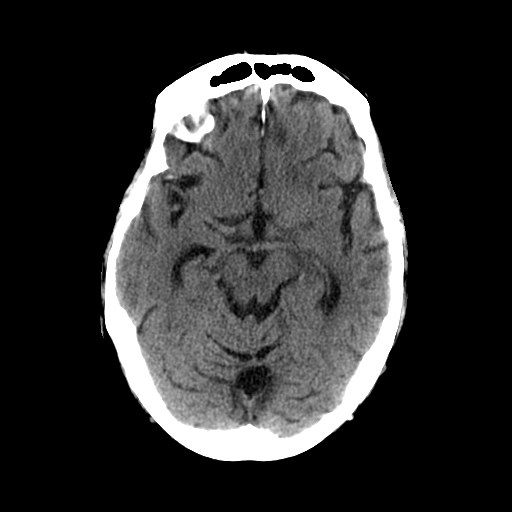
[im 12/32  bone]
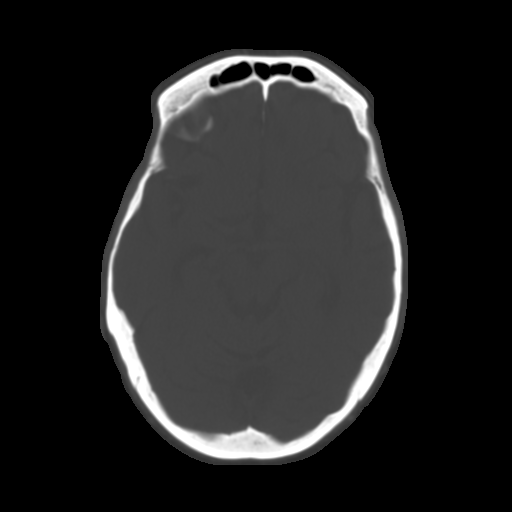
[im 14/32  brain]
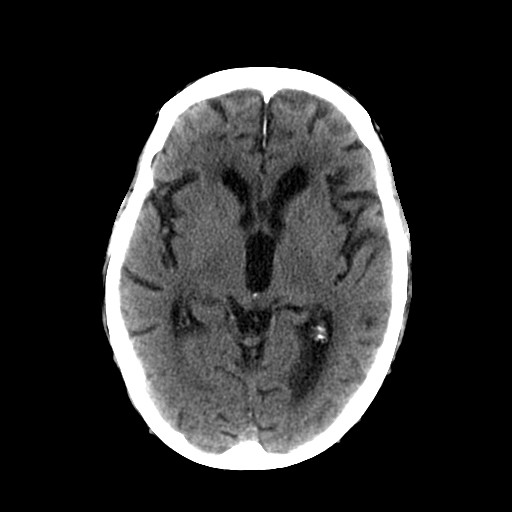
[im 16/32  brain]
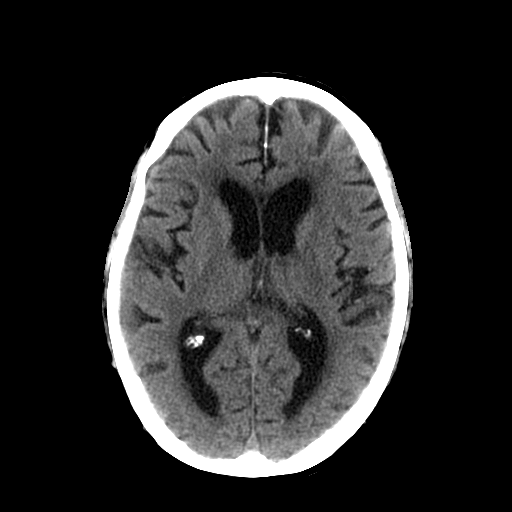
[im 18/32  brain]
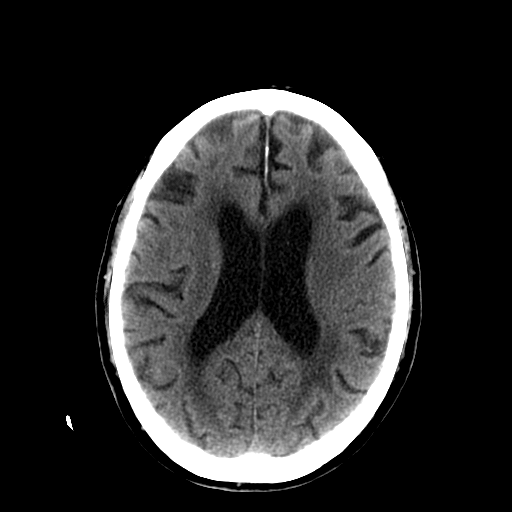
[im 20/32  brain]
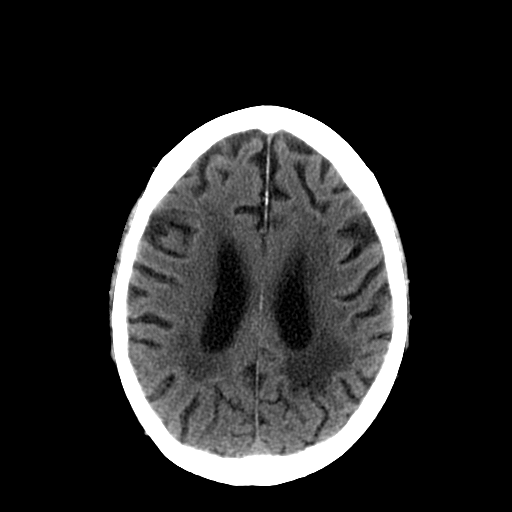
[im 20/32  bone]
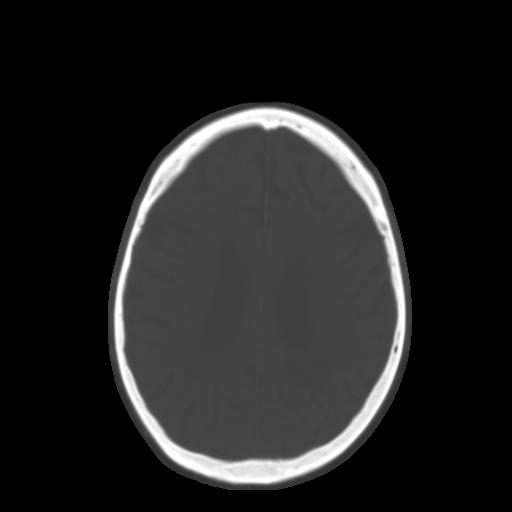
[im 23/32  brain]
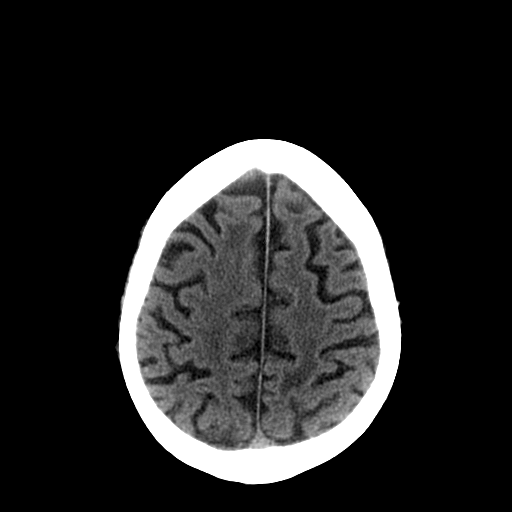
[im 25/32  brain]
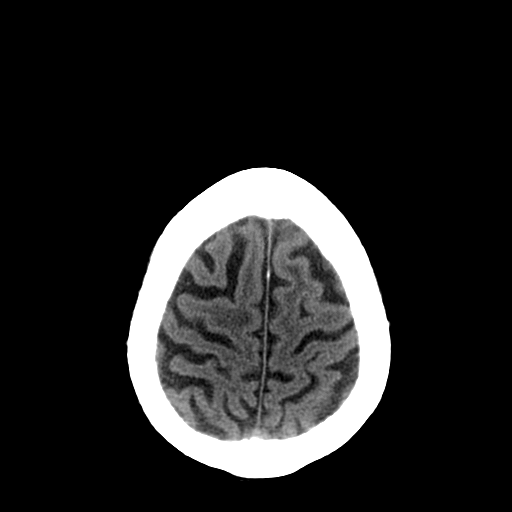
[im 27/32  brain]
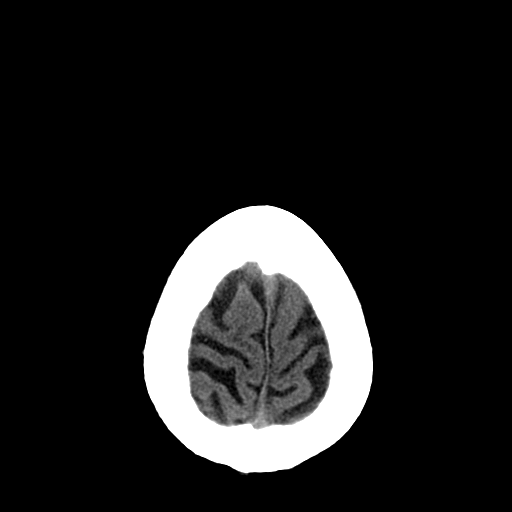
[im 29/32  brain]
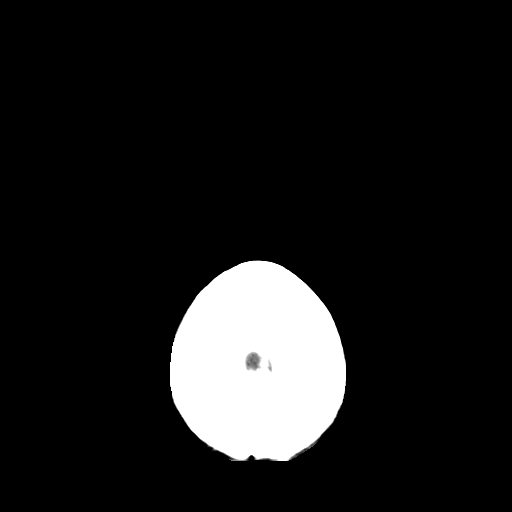
[im 29/32  bone]
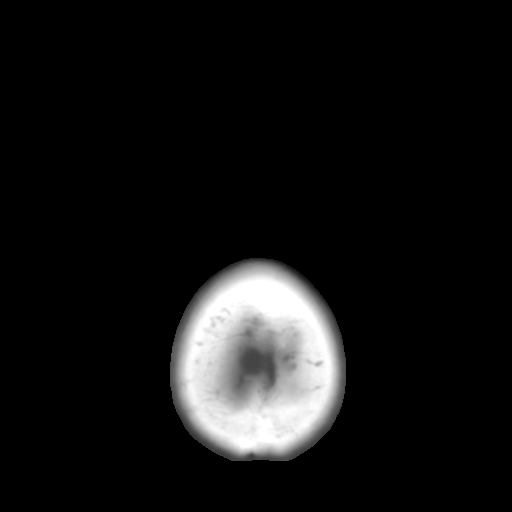

[13 of 30 positions shown; findings below may reference images not displayed]

FINDINGS: Mega cisterna magna noted with continued volume loss in
the cerebellar vermis.

Periventricular and corona radiata white matter hypodensities are
most compatible with chronic ischemic microvascular white matter
disease.

There is slightly more confluent white matter hypodensity in the
right frontal vertex which most likely simply due to advancing
chronic ischemic microvascular white matter disease.  No definite
intraorbital abnormal process on the right side is identified

Prominent atherosclerotic calcification noted.  There is a small
air-fluid level in the left maxillary sinus suspicious for acute
sinusitis, and mucosal thickening in the ethmoid air cells is
compatible with chronic sinusitis.
IMPRESSION: 1.  Extensive chronic ischemic microvascular white matter disease.
2. No intracranial hemorrhage, mass lesion, or acute infarction is
identified.
3.  Suspected mild left maxillary acute sinusitis.  Chronic ethmoid
sinusitis.

## 2012-03-08 ENCOUNTER — Other Ambulatory Visit: Payer: Self-pay | Admitting: Family Medicine

## 2012-03-28 ENCOUNTER — Telehealth: Payer: Self-pay | Admitting: Family Medicine

## 2012-03-28 NOTE — Telephone Encounter (Signed)
Lera wife of Zeki Bedrosian calling in for advice concerning medication. Attempted to call back twice. Line busy. No contact. [phone]

## 2012-03-29 ENCOUNTER — Telehealth: Payer: Self-pay

## 2012-03-29 MED ORDER — MEMANTINE HCL 10 MG PO TABS: 10.0000 mg | ORAL_TABLET | Freq: Two times a day (BID) | ORAL | Status: DC

## 2012-03-29 NOTE — Telephone Encounter (Signed)
Tell her to stay on the Aricept for now but add Namenda 10 mg bid to it. Sometimes the combination works better. Call in Namenda 10 mg bid, #60 with 11 rf. Given this a few months to see if it helps

## 2012-03-29 NOTE — Telephone Encounter (Signed)
I sent in script and spoke with pt's wife.

## 2012-03-29 NOTE — Telephone Encounter (Signed)
[facility] Triage Call Report Triage Record Num: [id number] Operator: Griselda Miner Patient Name: Christian [name] Call Date & Time: 03/28/2012 5:56:38PM Patient Phone: [phone] PCP: Tera Mater. Clent Ridges Patient Gender: Male PCP Fax : [phone] Patient DOB: [birth year] Practice Name: [facility] Reason for Call: Caller: Christian [name]; PCP: G[facility]); CB#: [phone]; Call regarding Medication; wife calls regarding the trial of using Donepezil to see if it improved her husband's memory. She notes he has been on it since January and she does not see any difference. He is not sleeping or eating well. She wanted to know if there was any need in continuing them or did she need to taper him off. She is currently out of the prescription and wanted to check before she got a refill. RN noted this would be in her chart but asked caller to also check back with office in the morning. Protocol(s) Used: Office Note Recommended Outcome per Protocol: Information Noted and Sent to Office Reason for Outcome: Caller information to office Care Advice: ~ 03/

## 2012-05-16 ENCOUNTER — Other Ambulatory Visit: Payer: Self-pay | Admitting: Cardiology

## 2012-05-19 ENCOUNTER — Other Ambulatory Visit: Payer: Self-pay

## 2012-05-19 MED ORDER — SIMVASTATIN 40 MG PO TABS: 40.0000 mg | ORAL_TABLET | Freq: Every day | ORAL | Status: DC

## 2012-05-19 NOTE — Telephone Encounter (Signed)
simvastatin (ZOCOR) 40 MG tablet  TAKE 1 TABLET AT BEDTIME   30 tablet    Patient Instructions    We will schedule you for an echocardiogram.  Continue your medication.  Follow up with Dr. Clent Ridges for your blood work.  I will see you again in 1 year.   Chart Reviewed By    Charna Elizabeth, LPN  on 45/40/9811 11:34 AM    [name], MD  on 12/15/2010  8:34 AM     Previous Visit      Provider Department Encounter #    10/30/2010 11:00 AM Peter [place], MD [facility] [id number]

## 2012-06-01 ENCOUNTER — Encounter (INDEPENDENT_AMBULATORY_CARE_PROVIDER_SITE_OTHER): Payer: Medicare Other

## 2012-06-01 DIAGNOSIS — I6529 Occlusion and stenosis of unspecified carotid artery: Secondary | ICD-10-CM

## 2012-06-07 NOTE — Progress Notes (Signed)
Quick Note:  I spoke with pt's wife. ______ 

## 2012-06-08 ENCOUNTER — Ambulatory Visit (INDEPENDENT_AMBULATORY_CARE_PROVIDER_SITE_OTHER): Payer: Medicare Other | Admitting: Family Medicine

## 2012-06-08 ENCOUNTER — Encounter: Payer: Self-pay | Admitting: Family Medicine

## 2012-06-08 VITALS — BP 128/54 | HR 97 | Temp 98.5°F | Wt 149.0 lb

## 2012-06-08 DIAGNOSIS — J209 Acute bronchitis, unspecified: Secondary | ICD-10-CM

## 2012-06-08 MED ORDER — AZITHROMYCIN 250 MG PO TABS: ORAL_TABLET | ORAL | Status: DC

## 2012-06-08 NOTE — Progress Notes (Signed)
  Subjective:    Patient ID: Christian [name], male    DOB: [birth year], 79 y.o.   MRN: [id number]  HPI Here for one week of chest tightness and a dry cough. No fever.    Review of Systems  Constitutional: Negative.   HENT: Negative.   Eyes: Negative.   Respiratory: Positive for cough and chest tightness. Negative for shortness of breath and wheezing.   Cardiovascular: Negative.        Objective:   Physical Exam  Constitutional: He appears well-developed and well-nourished. No distress.  HENT:  Right Ear: External ear normal.  Left Ear: External ear normal.  Nose: Nose normal.  Mouth/Throat: Oropharynx is clear and moist.  Eyes: Conjunctivae are normal.  Pulmonary/Chest: Effort normal and breath sounds normal. No respiratory distress. He has no wheezes. He has no rales. He exhibits no tenderness.  Lymphadenopathy:    He has no cervical adenopathy.          Assessment & Plan:  Add Mucinex and fluids

## 2012-06-24 ENCOUNTER — Encounter: Payer: Self-pay | Admitting: Cardiology

## 2012-07-10 ENCOUNTER — Other Ambulatory Visit: Payer: Self-pay | Admitting: Cardiology

## 2012-07-12 NOTE — Telephone Encounter (Signed)
Not seen since 2012 and needs to schedule an office visit to receive a refill of Simvastatin. I told his wife I can't refill it anymore since it has been so long since last office visit but I can send it to Dr. Sw[place]il[place] nurse and she can either refill it or deny it.

## 2012-07-14 ENCOUNTER — Other Ambulatory Visit: Payer: Self-pay | Admitting: *Deleted

## 2012-07-14 MED ORDER — SIMVASTATIN 40 MG PO TABS: 40.0000 mg | ORAL_TABLET | Freq: Every day | ORAL | Status: DC

## 2012-07-20 NOTE — Telephone Encounter (Signed)
Patient called spoke to wife patient past due to see Dr.Jordan.Stated Dr.Frye refilled simvastatin.Appointment scheduled with Dr.Jordan 09/29/12.

## 2012-09-12 ENCOUNTER — Other Ambulatory Visit: Payer: Self-pay | Admitting: Family Medicine

## 2012-09-29 ENCOUNTER — Ambulatory Visit (INDEPENDENT_AMBULATORY_CARE_PROVIDER_SITE_OTHER): Payer: Medicare Other | Admitting: Cardiology

## 2012-09-29 ENCOUNTER — Encounter: Payer: Self-pay | Admitting: Cardiology

## 2012-09-29 VITALS — BP 140/60 | HR 91 | Wt 144.0 lb

## 2012-09-29 DIAGNOSIS — I10 Essential (primary) hypertension: Secondary | ICD-10-CM

## 2012-09-29 DIAGNOSIS — Z952 Presence of prosthetic heart valve: Secondary | ICD-10-CM

## 2012-09-29 DIAGNOSIS — Z954 Presence of other heart-valve replacement: Secondary | ICD-10-CM

## 2012-09-29 DIAGNOSIS — I38 Endocarditis, valve unspecified: Secondary | ICD-10-CM

## 2012-09-29 DIAGNOSIS — E119 Type 2 diabetes mellitus without complications: Secondary | ICD-10-CM

## 2012-09-29 NOTE — Progress Notes (Signed)
Christian [name] Date of Birth: Jun 29, 1921 Medical Record #[id number]  History of Present Illness: Christian [name] is seen for followup cardiac evaluation today. He has a history of severe aortic stenosis and is status post bioprosthetic aortic valve replacement in 1999. His last echocardiogram was in December 2012. This valve was functioning well at that time. He also has carotid arterial disease and is status post left carotid endarterectomy in 2010. His most recent carotid Doppler in May of 2014 showed less than 60% stenosis bilaterally. He has progressive dementia. Since his last visit his wife reports that he is just not eating well. His blood sugars have been somewhat labile. He has lost weight from 171 down 244 pounds. He has slowed down a lot and is very sedentary. His wife thinks he is going to need a wheelchair soon. He has no active cardiac or neurologic symptoms.  Current Outpatient Prescriptions on File Prior to Visit  Medication Sig Dispense Refill  . aspirin 325 MG tablet Take 325 mg by mouth daily.        Marland Kitchen donepezil (ARICEPT) 10 MG tablet Take 1 tablet (10 mg total) by mouth at bedtime as needed.  30 tablet  11  . glyBURIDE (DIABETA) 5 MG tablet Take 1 tablet (5 mg total) by mouth 2 (two) times daily with a meal.  60 tablet  11  . glyBURIDE (DIABETA) 5 MG tablet TAKE 1 TABLET TWICE A DAY WITH A MEAL  60 tablet  3  . lisinopril (PRINIVIL,ZESTRIL) 20 MG tablet Take 1 tablet (20 mg total) by mouth daily.  30 tablet  11  . meclizine (ANTIVERT) 25 MG tablet Take 1 tablet (25 mg total) by mouth as needed for dizziness. FOR DIZZINESS  60 tablet  11  . memantine (NAMENDA) 10 MG tablet Take 1 tablet (10 mg total) by mouth 2 (two) times daily.  60 tablet  11  . multivitamin-lutein (OCUVITE-LUTEIN) CAPS Take 1 capsule by mouth daily.        . ONE TOUCH ULTRA TEST test strip USE ONE STRIP TO CHECK GLUCOSE EVERY DAY  100 each  2  . simvastatin (ZOCOR) 40 MG tablet Take 1 tablet (40 mg total) by  mouth at bedtime.  30 tablet  2  . sitaGLIPtin (JANUVIA) 100 MG tablet Take 1 tablet (100 mg total) by mouth daily.  30 tablet  11  . metFORMIN (GLUCOPHAGE) 1000 MG tablet Take 1 tablet (1,000 mg total) by mouth 2 (two) times daily with a meal.  60 tablet  11   No current facility-administered medications on file prior to visit.    Allergies  Allergen Reactions  . Penicillins Rash    Past Medical History  Diagnosis Date  . Flashers or floaters, right eye   . Hyperlipidemia   . DJD of shoulder   . Aortic stenosis     severe sees Dr. Refugia Laneve [name]  . Mitral insufficiency   . RBBB (right bundle branch block)   . LAFB (left anterior fascicular block)   . Shingles   . Diabetes mellitus   . Carotid artery disease     last dopplers on 05/23/09 were stable, LICA with 0-39% stenosis and RICA with 40-59%  . Acute arterial ischemic stroke, vertebrobasilar, thalamic 01/08  . Pneumonia 04/11  . Dementia     Past Surgical History  Procedure Laterality Date  . Tonsillectomy    . Colonoscopy  11/04    clear no repeats needed  . Aortic valve replacement  used bovine valve in 2000 per Christian [name]  . Carotid endarterectomy  01/13/08    left per Christian [name]    History  Smoking status  . Former Smoker  Smokeless tobacco  . Never Used    History  Alcohol Use No    No family history on file.  Review of Systems: As noted in history of present illness. All other systems were reviewed and are negative.  Physical Exam: BP 140/60  Pulse 91  Wt 65.318 kg (144 lb)  BMI 19.53 kg/m2 He is an elderly white male in no acute distress.The patient is alert and oriented x 2.   The skin is warm and dry.  Color is normal.  The HEENT exam is normal.  The carotids are 2+ with bilateral bruits.  There is no thyromegaly.  There is no JVD.  The lungs are clear.  The chest wall is non tender.  The heart exam reveals a regular rate with a normal S1 and S2.  There is a soft grade 1/6 systolic murmur the  right upper sternal border..  The PMI is not displaced.   Abdominal exam reveals good bowel sounds.   There is no hepatosplenomegaly or tenderness.  There are no masses.  Exam of the legs reveal no clubbing, cyanosis, or edema.  The legs are without rashes.  The distal pulses are intact.  Cranial nerves II - XII are intact.    LABORATORY DATA: ECG demonstrates normal sinus rhythm with a left anterior fascicular block and right bundle branch block. This is unchanged from old ECGs.  Assessment / Plan: 1. Aortic stenosis status post bioprosthetic aortic valve replacement in 1999. Exam is stable. Last echocardiogram showed good valve function. I will followup again in one year. 2. Carotid arterial disease status post carotid endarterectomy in 2010. Given his advanced dementia and age I have recommended no longer checking routine Dopplers yearly. I don't think we will intervene unless he was having significant symptoms. 3. Advanced dementia 4. Diabetes mellitus type 2 5. Dyslipidemia-on Zocor.

## 2012-09-29 NOTE — Patient Instructions (Signed)
Continue your current therapy  I will see you in one year   

## 2012-10-11 ENCOUNTER — Telehealth: Payer: Self-pay | Admitting: Family Medicine

## 2012-10-11 NOTE — Telephone Encounter (Signed)
Wife would like to know if there is something she can use on pt's bottom to help w/ redness. Pt has to sit so much and his bottom stays so red and has a burning sensation. Wife has been trying OTC meds. Was hoping Dr fry had an rx that would help relieve pt. .also pt has no appetite and has lost weight. Wife would appreciate any help she could get w/ his care.

## 2012-10-12 NOTE — Telephone Encounter (Signed)
I spoke with wife and she is not sure that she needs home health. I told her that I would discuss this further with Dr. Clent Ridges and call her back.

## 2012-10-12 NOTE — Telephone Encounter (Signed)
Call Home Health to assess the patient for nursing visit to assess his sore bottom and for aides to help her with bathing, dressing, etc. The diagnosis is dementia

## 2012-10-13 NOTE — Telephone Encounter (Signed)
I spoke with pt and she is going to schedule for pt to come in on 10/14/12 to see Dr. Clent Ridges.

## 2012-10-14 ENCOUNTER — Encounter: Payer: Self-pay | Admitting: Family Medicine

## 2012-10-14 ENCOUNTER — Ambulatory Visit (INDEPENDENT_AMBULATORY_CARE_PROVIDER_SITE_OTHER): Payer: Medicare Other | Admitting: Family Medicine

## 2012-10-14 VITALS — BP 116/50 | HR 88 | Temp 98.1°F

## 2012-10-14 DIAGNOSIS — L89109 Pressure ulcer of unspecified part of back, unspecified stage: Secondary | ICD-10-CM

## 2012-10-14 DIAGNOSIS — L8991 Pressure ulcer of unspecified site, stage 1: Secondary | ICD-10-CM

## 2012-10-14 DIAGNOSIS — Z23 Encounter for immunization: Secondary | ICD-10-CM

## 2012-10-14 DIAGNOSIS — I6350 Cerebral infarction due to unspecified occlusion or stenosis of unspecified cerebral artery: Secondary | ICD-10-CM

## 2012-10-14 DIAGNOSIS — F0390 Unspecified dementia without behavioral disturbance: Secondary | ICD-10-CM

## 2012-10-14 DIAGNOSIS — L89151 Pressure ulcer of sacral region, stage 1: Secondary | ICD-10-CM

## 2012-10-14 DIAGNOSIS — L89159 Pressure ulcer of sacral region, unspecified stage: Secondary | ICD-10-CM | POA: Insufficient documentation

## 2012-10-14 MED ORDER — SILVER SULFADIAZINE 1 % EX CREA: TOPICAL_CREAM | Freq: Two times a day (BID) | CUTANEOUS | Status: DC

## 2012-10-14 NOTE — Progress Notes (Signed)
  Subjective:    Patient ID: Christian [name], male    DOB: December 21, 1921, 79 y.o.   MRN: [id number]  HPI Here with his wife to check his bottom. His dementia continues to slowly worsen and he no longer walks at all. He sits in his wheelchair all the time, and his buttocks and sacral area are red and tender. She has been applying cortisone cream or Desitin cream.    Review of Systems  Constitutional: Negative.   Skin: Positive for color change.       Objective:   Physical Exam  Constitutional:  In his wheelchair, alert but very disoriented  Cardiovascular: Normal rate and intact distal pulses.   Pulmonary/Chest: Effort normal.  Skin:  The buttocks and sacrum are red but there is no breakdown yet           Assessment & Plan:  swithc to Silvadene cream bid. I wrote for a gel cushion to use in the wheelchair.

## 2012-12-26 ENCOUNTER — Ambulatory Visit (INDEPENDENT_AMBULATORY_CARE_PROVIDER_SITE_OTHER): Payer: Medicare Other | Admitting: Family Medicine

## 2012-12-26 ENCOUNTER — Encounter: Payer: Self-pay | Admitting: Family Medicine

## 2012-12-26 VITALS — BP 120/58 | Temp 97.9°F | Ht 69.0 in | Wt 125.0 lb

## 2012-12-26 DIAGNOSIS — E119 Type 2 diabetes mellitus without complications: Secondary | ICD-10-CM

## 2012-12-26 DIAGNOSIS — Z0000 Encounter for general adult medical examination without abnormal findings: Secondary | ICD-10-CM

## 2012-12-26 LAB — CBC WITH DIFFERENTIAL/PLATELET
Eosinophils Absolute: 0.1 10*3/uL (ref 0.0–0.7)
Eosinophils Relative: 0.9 % (ref 0.0–5.0)
HCT: 36.7 % — ABNORMAL LOW (ref 39.0–52.0)
Lymphocytes Relative: 32.7 % (ref 12.0–46.0)
Lymphs Abs: 2.9 10*3/uL (ref 0.7–4.0)
MCHC: 32.7 g/dL (ref 30.0–36.0)
Monocytes Relative: 6.2 % (ref 3.0–12.0)
Neutrophils Relative %: 59.9 % (ref 43.0–77.0)
Platelets: 160 10*3/uL (ref 150.0–400.0)
RDW: 15.2 % — ABNORMAL HIGH (ref 11.5–14.6)
WBC: 8.9 10*3/uL (ref 4.5–10.5)

## 2012-12-26 LAB — HEMOGLOBIN A1C: Hgb A1c MFr Bld: 7.2 % — ABNORMAL HIGH (ref 4.6–6.5)

## 2012-12-26 LAB — PSA: PSA: 0.39 ng/mL (ref 0.10–4.00)

## 2012-12-26 LAB — BASIC METABOLIC PANEL
Calcium: 9.6 mg/dL (ref 8.4–10.5)
GFR: 51.54 mL/min — ABNORMAL LOW (ref >60.00)
Sodium: 144 mEq/L (ref 135–145)

## 2012-12-26 LAB — HEPATIC FUNCTION PANEL
AST: 42 U/L — ABNORMAL HIGH (ref 0–37)
Alkaline Phosphatase: 85 U/L (ref 39–117)
Bilirubin, Direct: 0.1 mg/dL (ref 0.0–0.3)
Total Bilirubin: 0.4 mg/dL (ref 0.3–1.2)
Total Protein: 7 g/dL (ref 6.0–8.3)

## 2012-12-26 LAB — POCT URINALYSIS DIPSTICK
Glucose, UA: NEGATIVE
Spec Grav, UA: >=1.03
Urobilinogen, UA: 0.2

## 2012-12-26 LAB — LIPID PANEL
Cholesterol: 97 mg/dL (ref 0–200)
HDL: 42.2 mg/dL (ref >39.00)
LDL Cholesterol: 30 mg/dL (ref 0–99)
Triglycerides: 123 mg/dL (ref 0.0–149.0)
VLDL: 24.6 mg/dL (ref 0.0–40.0)

## 2012-12-26 LAB — TSH: TSH: 0.06 u[IU]/mL — ABNORMAL LOW (ref 0.35–5.50)

## 2012-12-26 MED ORDER — GLUCOSE BLOOD VI STRP: ORAL_STRIP | Status: DC

## 2012-12-26 MED ORDER — SIMVASTATIN 40 MG PO TABS: 40.0000 mg | ORAL_TABLET | Freq: Every day | ORAL | Status: DC

## 2012-12-26 MED ORDER — SITAGLIPTIN PHOSPHATE 100 MG PO TABS: 100.0000 mg | ORAL_TABLET | Freq: Every day | ORAL | Status: DC

## 2012-12-26 MED ORDER — DONEPEZIL HCL 10 MG PO TABS: 10.0000 mg | ORAL_TABLET | Freq: Every evening | ORAL | Status: DC | PRN

## 2012-12-26 MED ORDER — MEMANTINE HCL 10 MG PO TABS: 10.0000 mg | ORAL_TABLET | Freq: Two times a day (BID) | ORAL | Status: DC

## 2012-12-26 MED ORDER — METFORMIN HCL 1000 MG PO TABS: 1000.0000 mg | ORAL_TABLET | Freq: Two times a day (BID) | ORAL | Status: DC

## 2012-12-26 MED ORDER — GLYBURIDE 5 MG PO TABS: 5.0000 mg | ORAL_TABLET | Freq: Two times a day (BID) | ORAL | Status: DC

## 2012-12-26 MED ORDER — LISINOPRIL 20 MG PO TABS: 20.0000 mg | ORAL_TABLET | Freq: Every day | ORAL | Status: DC

## 2012-12-26 NOTE — Progress Notes (Signed)
Pre visit review using our clinic review tool, if applicable. No additional management support is needed unless otherwise documented below in the visit note. 

## 2012-12-26 NOTE — Progress Notes (Signed)
   Subjective:    Patient ID: Christian [name], male    DOB: Jun 16, 1921, 79 y.o.   MRN: [id number]  HPI 79 yr old male with his wife for a cpx. He is doing fairly well in general. His dementia seems to have stabilized over the past year with the combination of Aricept and Namenda. He still recognizes family members most of the time. He gets out to church every few weeks. His glucoses are stable although his wife will sometimes hold his night time meds if his glucoses get too low. His appetite remains poor. He takes 2 cans of Ensure a day.    Review of Systems  Constitutional: Negative.   HENT: Negative.   Eyes: Negative.   Respiratory: Negative.   Cardiovascular: Negative.   Gastrointestinal: Negative.   Genitourinary: Negative.   Musculoskeletal: Negative.   Skin: Negative.   Neurological: Negative.   Psychiatric/Behavioral: Negative.        Objective:   Physical Exam  Constitutional: He appears well-developed and well-nourished. No distress.  HENT:  Head: Normocephalic and atraumatic.  Right Ear: External ear normal.  Left Ear: External ear normal.  Nose: Nose normal.  Mouth/Throat: Oropharynx is clear and moist. No oropharyngeal exudate.  Eyes: Conjunctivae and EOM are normal. Pupils are equal, round, and reactive to light. Right eye exhibits no discharge. Left eye exhibits no discharge. No scleral icterus.  Neck: Neck supple. No JVD present. No tracheal deviation present. No thyromegaly present.  Cardiovascular: Normal rate, regular rhythm, normal heart sounds and intact distal pulses.  Exam reveals no gallop and no friction rub.   No murmur heard. Pulmonary/Chest: Effort normal and breath sounds normal. No respiratory distress. He has no wheezes. He has no rales. He exhibits no tenderness.  Abdominal: Soft. Bowel sounds are normal. He exhibits no distension and no mass. There is no tenderness. There is no rebound and no guarding.  Genitourinary: Rectum normal, prostate normal  and penis normal. Guaiac negative stool. No penile tenderness.  Musculoskeletal: Normal range of motion. He exhibits no edema and no tenderness.  Lymphadenopathy:    He has no cervical adenopathy.  Neurological: He is alert. He has normal reflexes. No cranial nerve deficit. He exhibits normal muscle tone.  Skin: Skin is warm and dry. No rash noted. He is not diaphoretic. No erythema. No pallor.  Psychiatric: He has a normal mood and affect. His behavior is normal. Judgment and thought content normal.          Assessment & Plan:  Well exam. Get fasting labs.

## 2013-01-09 ENCOUNTER — Other Ambulatory Visit (INDEPENDENT_AMBULATORY_CARE_PROVIDER_SITE_OTHER): Payer: Medicare Other

## 2013-01-09 DIAGNOSIS — E039 Hypothyroidism, unspecified: Secondary | ICD-10-CM

## 2013-01-09 LAB — T3, FREE: T3, Free: 3 pg/mL (ref 2.3–4.2)

## 2013-01-09 LAB — T4, FREE: Free T4: 1.03 ng/dL (ref 0.60–1.60)

## 2013-01-14 ENCOUNTER — Other Ambulatory Visit: Payer: Self-pay | Admitting: Family Medicine

## 2013-01-27 ENCOUNTER — Telehealth: Payer: Self-pay | Admitting: Family Medicine

## 2013-01-27 MED ORDER — GLUCOSE BLOOD VI STRP: ORAL_STRIP | Status: DC

## 2013-01-27 MED ORDER — ACCU-CHEK AVIVA PLUS W/DEVICE KIT: PACK | Status: AC

## 2013-01-27 MED ORDER — ACCU-CHEK SOFT TOUCH LANCETS MISC: Status: AC

## 2013-01-27 NOTE — Telephone Encounter (Signed)
Per pharmacy, order Accu check Aviva plus machine, strips, & lancets. I did send in scripts e-scribe for all 3 and spoke with family.

## 2013-01-27 NOTE — Telephone Encounter (Signed)
Pt wife calling for new rx for test strips states insurance will not cover one touch ultra test strips, call [facility] [phone]

## 2013-03-09 ENCOUNTER — Inpatient Hospital Stay (HOSPITAL_COMMUNITY)
Admission: EM | Admit: 2013-03-09 | Discharge: 2013-03-16 | DRG: 871 | Disposition: A | Discharge status: Skilled Nursing Facility | Payer: Medicare Other | Attending: Internal Medicine | Admitting: Internal Medicine

## 2013-03-09 ENCOUNTER — Emergency Department (HOSPITAL_COMMUNITY): Payer: Medicare Other

## 2013-03-09 DIAGNOSIS — R1314 Dysphagia, pharyngoesophageal phase: Secondary | ICD-10-CM

## 2013-03-09 DIAGNOSIS — I6529 Occlusion and stenosis of unspecified carotid artery: Secondary | ICD-10-CM | POA: Diagnosis present

## 2013-03-09 DIAGNOSIS — G929 Unspecified toxic encephalopathy: Secondary | ICD-10-CM

## 2013-03-09 DIAGNOSIS — R531 Weakness: Secondary | ICD-10-CM

## 2013-03-09 DIAGNOSIS — R7881 Bacteremia: Secondary | ICD-10-CM

## 2013-03-09 DIAGNOSIS — E119 Type 2 diabetes mellitus without complications: Secondary | ICD-10-CM

## 2013-03-09 DIAGNOSIS — R1310 Dysphagia, unspecified: Secondary | ICD-10-CM | POA: Diagnosis present

## 2013-03-09 DIAGNOSIS — F0390 Unspecified dementia without behavioral disturbance: Secondary | ICD-10-CM

## 2013-03-09 DIAGNOSIS — N179 Acute kidney failure, unspecified: Secondary | ICD-10-CM

## 2013-03-09 DIAGNOSIS — R6520 Severe sepsis without septic shock: Secondary | ICD-10-CM

## 2013-03-09 DIAGNOSIS — Z681 Body mass index (BMI) 19 or less, adult: Secondary | ICD-10-CM

## 2013-03-09 DIAGNOSIS — G92 Toxic encephalopathy: Secondary | ICD-10-CM

## 2013-03-09 DIAGNOSIS — Z952 Presence of prosthetic heart valve: Secondary | ICD-10-CM

## 2013-03-09 DIAGNOSIS — B953 Streptococcus pneumoniae as the cause of diseases classified elsewhere: Secondary | ICD-10-CM

## 2013-03-09 DIAGNOSIS — J189 Pneumonia, unspecified organism: Secondary | ICD-10-CM

## 2013-03-09 DIAGNOSIS — Z8673 Personal history of transient ischemic attack (TIA), and cerebral infarction without residual deficits: Secondary | ICD-10-CM

## 2013-03-09 DIAGNOSIS — I658 Occlusion and stenosis of other precerebral arteries: Secondary | ICD-10-CM | POA: Diagnosis present

## 2013-03-09 DIAGNOSIS — I38 Endocarditis, valve unspecified: Secondary | ICD-10-CM

## 2013-03-09 DIAGNOSIS — E875 Hyperkalemia: Secondary | ICD-10-CM

## 2013-03-09 DIAGNOSIS — Z7982 Long term (current) use of aspirin: Secondary | ICD-10-CM

## 2013-03-09 DIAGNOSIS — E860 Dehydration: Secondary | ICD-10-CM | POA: Diagnosis present

## 2013-03-09 DIAGNOSIS — Z87891 Personal history of nicotine dependence: Secondary | ICD-10-CM

## 2013-03-09 DIAGNOSIS — A403 Sepsis due to Streptococcus pneumoniae: Principal | ICD-10-CM | POA: Diagnosis present

## 2013-03-09 DIAGNOSIS — E785 Hyperlipidemia, unspecified: Secondary | ICD-10-CM | POA: Diagnosis present

## 2013-03-09 DIAGNOSIS — E43 Unspecified severe protein-calorie malnutrition: Secondary | ICD-10-CM

## 2013-03-09 DIAGNOSIS — Z79899 Other long term (current) drug therapy: Secondary | ICD-10-CM

## 2013-03-09 DIAGNOSIS — A419 Sepsis, unspecified organism: Secondary | ICD-10-CM

## 2013-03-09 LAB — CBC WITH DIFFERENTIAL/PLATELET
Basophils Absolute: 0 10*3/uL (ref 0.0–0.1)
Basophils Relative: 0 % (ref 0–1)
Eosinophils Absolute: 0 10*3/uL (ref 0.0–0.7)
Eosinophils Relative: 0 % (ref 0–5)
HCT: 39.4 % (ref 39.0–52.0)
Hemoglobin: 13 g/dL (ref 13.0–17.0)
Lymphocytes Relative: 6 % — ABNORMAL LOW (ref 12–46)
Lymphs Abs: 0.7 10*3/uL (ref 0.7–4.0)
MCH: 30.4 pg (ref 26.0–34.0)
MCHC: 33 g/dL (ref 30.0–36.0)
MCV: 92.1 fL (ref 78.0–100.0)
Monocytes Absolute: 0.4 10*3/uL (ref 0.1–1.0)
Monocytes Relative: 4 % (ref 3–12)
Neutro Abs: 10 10*3/uL — ABNORMAL HIGH (ref 1.7–7.7)
Neutrophils Relative %: 90 % — ABNORMAL HIGH (ref 43–77)
Platelets: 175 10*3/uL (ref 150–400)
RBC: 4.28 MIL/uL (ref 4.22–5.81)
RDW: 14.2 % (ref 11.5–15.5)
WBC: 11.1 10*3/uL — ABNORMAL HIGH (ref 4.0–10.5)

## 2013-03-09 LAB — URINALYSIS, ROUTINE W REFLEX MICROSCOPIC
Bilirubin Urine: NEGATIVE
Glucose, UA: 100 mg/dL — AB
Hgb urine dipstick: NEGATIVE
Ketones, ur: 15 mg/dL — AB
Leukocytes, UA: NEGATIVE
Nitrite: NEGATIVE
Protein, ur: NEGATIVE mg/dL
Specific Gravity, Urine: 1.016 (ref 1.005–1.030)
Urobilinogen, UA: 0.2 mg/dL (ref 0.0–1.0)
pH: 6 (ref 5.0–8.0)

## 2013-03-09 LAB — I-STAT CG4 LACTIC ACID, ED: Lactic Acid, Venous: 3.17 mmol/L — ABNORMAL HIGH (ref 0.5–2.2)

## 2013-03-09 LAB — COMPREHENSIVE METABOLIC PANEL
ALT: 11 U/L (ref 0–53)
AST: 17 U/L (ref 0–37)
Albumin: 3.9 g/dL (ref 3.5–5.2)
Alkaline Phosphatase: 117 U/L (ref 39–117)
BUN: 49 mg/dL — ABNORMAL HIGH (ref 6–23)
CO2: 22 mEq/L (ref 19–32)
Calcium: 9.9 mg/dL (ref 8.4–10.5)
Chloride: 103 mEq/L (ref 96–112)
Creatinine, Ser: 1.41 mg/dL — ABNORMAL HIGH (ref 0.50–1.35)
GFR calc Af Amer: 49 mL/min — ABNORMAL LOW (ref >90)
GFR calc non Af Amer: 43 mL/min — ABNORMAL LOW (ref >90)
Glucose, Bld: 220 mg/dL — ABNORMAL HIGH (ref 70–99)
Potassium: 6.7 mEq/L (ref 3.7–5.3)
Sodium: 141 mEq/L (ref 137–147)
Total Bilirubin: 0.3 mg/dL (ref 0.3–1.2)
Total Protein: 7.6 g/dL (ref 6.0–8.3)

## 2013-03-09 LAB — I-STAT ARTERIAL BLOOD GAS, ED
Acid-base deficit: 5 mmol/L — ABNORMAL HIGH (ref 0.0–2.0)
Bicarbonate: 19.7 mEq/L — ABNORMAL LOW (ref 20.0–24.0)
O2 Saturation: 99 %
Patient temperature: 98.6
TCO2: 21 mmol/L (ref 0–100)
pCO2 arterial: 34.8 mmHg — ABNORMAL LOW (ref 35.0–45.0)
pH, Arterial: 7.361 (ref 7.350–7.450)
pO2, Arterial: 155 mmHg — ABNORMAL HIGH (ref 80.0–100.0)

## 2013-03-09 LAB — CBG MONITORING, ED: Glucose-Capillary: 219 mg/dL — ABNORMAL HIGH (ref 70–99)

## 2013-03-09 LAB — MRSA PCR SCREENING: MRSA BY PCR: NEGATIVE

## 2013-03-09 LAB — GLUCOSE, CAPILLARY: Glucose-Capillary: 184 mg/dL — ABNORMAL HIGH (ref 70–99)

## 2013-03-09 MED ORDER — ACETAMINOPHEN 325 MG PO TABS: 650.0000 mg | ORAL_TABLET | Freq: Four times a day (QID) | ORAL | Status: DC | PRN

## 2013-03-09 MED ORDER — MECLIZINE HCL 25 MG PO TABS
25.0000 mg | ORAL_TABLET | ORAL | Status: DC | PRN
  Filled 2013-03-09: qty 1

## 2013-03-09 MED ORDER — SODIUM CHLORIDE 0.9 % IV SOLN
INTRAVENOUS | Status: DC
  Administered 2013-03-10: 1000 mL via INTRAVENOUS
  Administered 2013-03-11 (×2): via INTRAVENOUS

## 2013-03-09 MED ORDER — DEXTROSE 5 % IV SOLN: 2.0000 g | Freq: Three times a day (TID) | INTRAVENOUS | Status: DC

## 2013-03-09 MED ORDER — SODIUM POLYSTYRENE SULFONATE 15 GM/60ML PO SUSP
15.0000 g | Freq: Once | ORAL | Status: AC
  Administered 2013-03-10: 15 g via ORAL
  Filled 2013-03-09: qty 60

## 2013-03-09 MED ORDER — DEXTROSE 5 % IV SOLN
2.0000 g | Freq: Once | INTRAVENOUS | Status: AC
  Administered 2013-03-09: 2 g via INTRAVENOUS
  Filled 2013-03-09: qty 2

## 2013-03-09 MED ORDER — ASPIRIN 325 MG PO TABS
325.0000 mg | ORAL_TABLET | Freq: Every day | ORAL | Status: DC
  Administered 2013-03-10 – 2013-03-16 (×7): 325 mg via ORAL
  Filled 2013-03-09 (×7): qty 1

## 2013-03-09 MED ORDER — DONEPEZIL HCL 10 MG PO TABS
10.0000 mg | ORAL_TABLET | Freq: Every day | ORAL | Status: DC
  Administered 2013-03-09 – 2013-03-15 (×7): 10 mg via ORAL
  Filled 2013-03-09 (×8): qty 1

## 2013-03-09 MED ORDER — DEXTROSE 50 % IV SOLN
1.0000 | Freq: Once | INTRAVENOUS | Status: AC
  Administered 2013-03-09: 50 mL via INTRAVENOUS
  Filled 2013-03-09: qty 50

## 2013-03-09 MED ORDER — SODIUM CHLORIDE 0.9 % IV SOLN
1.0000 g | Freq: Once | INTRAVENOUS | Status: AC
  Administered 2013-03-09: 1 g via INTRAVENOUS
  Filled 2013-03-09: qty 10

## 2013-03-09 MED ORDER — DEXTROSE 5 % IV SOLN
1.0000 g | Freq: Three times a day (TID) | INTRAVENOUS | Status: DC
  Filled 2013-03-09: qty 1

## 2013-03-09 MED ORDER — HEPARIN SODIUM (PORCINE) 5000 UNIT/ML IJ SOLN
5000.0000 [IU] | Freq: Three times a day (TID) | INTRAMUSCULAR | Status: DC
  Administered 2013-03-09 – 2013-03-16 (×21): 5000 [IU] via SUBCUTANEOUS
  Filled 2013-03-09 (×23): qty 1

## 2013-03-09 MED ORDER — OCUVITE-LUTEIN PO CAPS
1.0000 | ORAL_CAPSULE | Freq: Every day | ORAL | Status: DC
  Administered 2013-03-10 – 2013-03-16 (×7): 1 via ORAL
  Filled 2013-03-09 (×7): qty 1

## 2013-03-09 MED ORDER — MEMANTINE HCL 10 MG PO TABS
10.0000 mg | ORAL_TABLET | Freq: Two times a day (BID) | ORAL | Status: DC
  Administered 2013-03-09 – 2013-03-16 (×14): 10 mg via ORAL
  Filled 2013-03-09 (×15): qty 1

## 2013-03-09 MED ORDER — VANCOMYCIN HCL IN DEXTROSE 1-5 GM/200ML-% IV SOLN
1000.0000 mg | Freq: Once | INTRAVENOUS | Status: AC
  Administered 2013-03-09: 1000 mg via INTRAVENOUS
  Filled 2013-03-09: qty 200

## 2013-03-09 MED ORDER — SIMVASTATIN 40 MG PO TABS
40.0000 mg | ORAL_TABLET | Freq: Every day | ORAL | Status: DC
  Administered 2013-03-09 – 2013-03-15 (×7): 40 mg via ORAL
  Filled 2013-03-09 (×8): qty 1

## 2013-03-09 MED ORDER — SODIUM CHLORIDE 0.9 % IV BOLUS (SEPSIS)
2000.0000 mL | Freq: Once | INTRAVENOUS | Status: AC
  Administered 2013-03-09: 2000 mL via INTRAVENOUS

## 2013-03-09 MED ORDER — INSULIN ASPART 100 UNIT/ML ~~LOC~~ SOLN
4.0000 [IU] | Freq: Once | SUBCUTANEOUS | Status: AC
  Administered 2013-03-09: 4 [IU] via INTRAVENOUS
  Filled 2013-03-09: qty 1

## 2013-03-09 MED ORDER — DEXTROSE 5 % IV SOLN
1.0000 g | Freq: Three times a day (TID) | INTRAVENOUS | Status: DC
  Administered 2013-03-10 – 2013-03-12 (×8): 1 g via INTRAVENOUS
  Filled 2013-03-09 (×10): qty 1

## 2013-03-09 MED ORDER — VANCOMYCIN HCL 500 MG IV SOLR
500.0000 mg | INTRAVENOUS | Status: DC
  Filled 2013-03-09: qty 500

## 2013-03-09 MED ORDER — ALBUTEROL SULFATE (2.5 MG/3ML) 0.083% IN NEBU
5.0000 mg | INHALATION_SOLUTION | Freq: Once | RESPIRATORY_TRACT | Status: AC
  Administered 2013-03-09: 5 mg via RESPIRATORY_TRACT
  Filled 2013-03-09: qty 6

## 2013-03-09 MED ORDER — INSULIN ASPART 100 UNIT/ML ~~LOC~~ SOLN
0.0000 [IU] | SUBCUTANEOUS | Status: DC
  Administered 2013-03-10: 1 [IU] via SUBCUTANEOUS
  Administered 2013-03-10 – 2013-03-11 (×4): 2 [IU] via SUBCUTANEOUS
  Administered 2013-03-11: 1 [IU] via SUBCUTANEOUS
  Administered 2013-03-11: 5 [IU] via SUBCUTANEOUS
  Administered 2013-03-12: 1 [IU] via SUBCUTANEOUS
  Administered 2013-03-12: 2 [IU] via SUBCUTANEOUS
  Administered 2013-03-12: 3 [IU] via SUBCUTANEOUS
  Administered 2013-03-12: 1 [IU] via SUBCUTANEOUS
  Administered 2013-03-12: 5 [IU] via SUBCUTANEOUS
  Administered 2013-03-13: 1 [IU] via SUBCUTANEOUS
  Administered 2013-03-13: 2 [IU] via SUBCUTANEOUS
  Administered 2013-03-13: 1 [IU] via SUBCUTANEOUS
  Administered 2013-03-13: 3 [IU] via SUBCUTANEOUS
  Administered 2013-03-13: 2 [IU] via SUBCUTANEOUS

## 2013-03-09 NOTE — ED Notes (Addendum)
Pt. Less pale and more pink. Pt able to form full sentences, less lethargic. HR decreased to 112 bpm  Wife sts pt looks better. Wife updated on plan of care.

## 2013-03-09 NOTE — ED Provider Notes (Signed)
CSN: [id number]     Arrival date & time 03/09/13  1722 History   First MD Initiated Contact with Patient 03/09/13 1733     Chief Complaint  Patient presents with  . Altered Mental Status     (Consider location/radiation/quality/duration/timing/severity/associated sxs/prior Treatment) HPI Patient presents emergency department with altered mental status, and lethargy.  The wife states, that the patient awoke and did not appear to be in his normal state seemed little more weak, generally speaking.  Patient will usually talk to the wife, but did not respond to her today.  Patient is unable to answer any questions for me.  EMS found.  The patient had a pulse oximetry of 85% and was tachycardic at 124 had a fever but 101.7. Past Medical History  Diagnosis Date  . Flashers or floaters, right eye   . Hyperlipidemia   . DJD of shoulder   . Aortic stenosis     severe sees Dr. Peter Martinique  . Mitral insufficiency   . RBBB (right bundle branch block)   . LAFB (left anterior fascicular block)   . Shingles   . Diabetes mellitus   . Carotid artery disease     last dopplers on 8/67/67 were stable, LICA with 2-09% stenosis and RICA with 40-59%  . Acute arterial ischemic stroke, vertebrobasilar, thalamic 01/08  . Pneumonia 04/11  . Dementia    Past Surgical History  Procedure Laterality Date  . Tonsillectomy    . Colonoscopy  11/04    clear no repeats needed  . Aortic valve replacement      used bovine valve in 2000 per Dr. Cyndia Bent  . Carotid endarterectomy  01/13/08    left per Dr. Kellie Simmering   No family history on file. History  Substance Use Topics  . Smoking status: Former Research scientist (life sciences)  . Smokeless tobacco: Never Used  . Alcohol Use: No    Review of Systems  Level V caveat applies due to dementia  Allergies  Penicillins  Home Medications   Current Outpatient Rx  Name  Route  Sig  Dispense  Refill  . aspirin 325 MG tablet   Oral   Take 325 mg by mouth daily.           . Blood  Glucose Monitoring Suppl (ACCU-CHEK AVIVA PLUS) W/DEVICE KIT      Test once per day and diagnosis code is 250.00   1 kit   0   . donepezil (ARICEPT) 10 MG tablet   Oral   Take 1 tablet (10 mg total) by mouth at bedtime as needed.   30 tablet   11   . glucose blood (ACCU-CHEK AVIVA) test strip      Test once per day   100 each   1     Dispense plus strips,  diagnosis code is 250.00   . glyBURIDE (DIABETA) 5 MG tablet   Oral   Take 1 tablet (5 mg total) by mouth 2 (two) times daily with a meal.   60 tablet   11   . Lancets (ACCU-CHEK SOFT TOUCH) lancets      Test once per day   100 each   1     Dispense for Accucheck aviva plus and diagnosis co ...   . lisinopril (PRINIVIL,ZESTRIL) 20 MG tablet   Oral   Take 1 tablet (20 mg total) by mouth daily.   30 tablet   11   . meclizine (ANTIVERT) 25 MG tablet   Oral  Take 1 tablet (25 mg total) by mouth as needed for dizziness. FOR DIZZINESS   60 tablet   11   . memantine (NAMENDA) 10 MG tablet   Oral   Take 1 tablet (10 mg total) by mouth 2 (two) times daily.   60 tablet   11   . metFORMIN (GLUCOPHAGE) 1000 MG tablet   Oral   Take 1 tablet (1,000 mg total) by mouth 2 (two) times daily with a meal.   60 tablet   11   . multivitamin-lutein (OCUVITE-LUTEIN) CAPS   Oral   Take 1 capsule by mouth daily.           . silver sulfADIAZINE (SILVADENE) 1 % cream   Topical   Apply topically 2 (two) times daily.   400 g   5   . simvastatin (ZOCOR) 40 MG tablet   Oral   Take 1 tablet (40 mg total) by mouth at bedtime.   30 tablet   11   . sitaGLIPtin (JANUVIA) 100 MG tablet   Oral   Take 1 tablet (100 mg total) by mouth daily.   30 tablet   11    BP 110/43  Pulse 114  Temp(Src) 99 F (37.2 C) (Oral)  Resp 21  Ht 6' (1.829 m)  Wt 125 lb (56.7 kg)  BMI 16.95 kg/m2  SpO2 96% Physical Exam  Nursing note and vitals reviewed. Constitutional: He appears well-developed and well-nourished. No distress.   HENT:  Head: Normocephalic and atraumatic.  Eyes: Pupils are equal, round, and reactive to light.  Neck: Normal range of motion. Neck supple.  Cardiovascular: Regular rhythm and normal heart sounds.  Tachycardia present.  Exam reveals no friction rub.   No murmur heard. Pulmonary/Chest: No accessory muscle usage. Tachypnea noted. No respiratory distress. He has rhonchi.  Abdominal: Bowel sounds are normal. He exhibits no distension. There is no tenderness.  Neurological: He is alert.  Skin: Skin is warm and dry. No rash noted. No erythema.    ED Course  Procedures (including critical care time) Labs Review Labs Reviewed  CBC WITH DIFFERENTIAL - Abnormal; Notable for the following:    WBC 11.1 (*)    Neutrophils Relative % 90 (*)    Neutro Abs 10.0 (*)    Lymphocytes Relative 6 (*)    All other components within normal limits  COMPREHENSIVE METABOLIC PANEL - Abnormal; Notable for the following:    Potassium 6.7 (*)    Glucose, Bld 220 (*)    BUN 49 (*)    Creatinine, Ser 1.41 (*)    GFR calc non Af Amer 43 (*)    GFR calc Af Amer 49 (*)    All other components within normal limits  URINALYSIS, ROUTINE W REFLEX MICROSCOPIC - Abnormal; Notable for the following:    Glucose, UA 100 (*)    Ketones, ur 15 (*)    All other components within normal limits  I-STAT CG4 LACTIC ACID, ED - Abnormal; Notable for the following:    Lactic Acid, Venous 3.17 (*)    All other components within normal limits  URINE CULTURE  CULTURE, BLOOD (ROUTINE X 2)  CULTURE, BLOOD (ROUTINE X 2)  I-STAT CG4 LACTIC ACID, ED   Imaging Review Dg Chest Port 1 View  03/09/2013   CLINICAL DATA:  Cough and congestion. Chills. Altered mental status.  EXAM: PORTABLE CHEST - 1 VIEW  COMPARISON:  Two-view chest x-ray 01/03/2011.  FINDINGS: Heart size is normal. Atherosclerotic calcifications are  again noted. Fused perihilar bronchitic changes are again seen. Superimposed ill-defined airspace disease is present at  the lung bases, right greater than left. The visualized soft tissues and bony thorax are unremarkable.  IMPRESSION: 1. Acute on chronic perihilar bronchitic changes bilaterally. 2. Superimposed she right lower lobe airspace disease is concerning for early pneumonia. 3. Atherosclerosis.   Electronically Signed   By: Lawrence Santiago M.D.   On: 03/09/2013 18:14     EKG Interpretation   Date/Time:  Thursday March 09 2013 17:24:21 EST Ventricular Rate:  123 PR Interval:  141 QRS Duration: 122 QT Interval:  323 QTC Calculation: 462 R Axis:   -93 Text Interpretation:  Sinus tachycardia RBBB and LAFB Probable lateral  infarct, age indeterminate ST elevation, consider inferior injury No  significant change since last tracing Confirmed by WARD,  DO, KRISTEN  ([id number]) on 03/09/2013 6:13:34 PM       CRITICAL CARE Performed by: Brent General Total critical care time: 40 minutes Critical care time was exclusive of separately billable procedures and treating other patients. Critical care was necessary to treat or prevent imminent or life-threatening deterioration. Critical care was time spent personally by me on the following activities: development of treatment plan with patient and/or surrogate as well as nursing, discussions with consultants, evaluation of patient's response to treatment, examination of patient, obtaining history from patient or surrogate, ordering and performing treatments and interventions, ordering and review of laboratory studies, ordering and review of radiographic studies, pulse oximetry and re-evaluation of patient's condition.  Patient is septic and was treated as a level II sepsis.  Patient was initiated on antibiotics.  IV fluids and eventually had to be placed on nonrebreather mask to support his oxygenation.  Patient was admitted to the hospital to step down unit.  Wife and son are given the plan and results.  Questions were answered.  Brent General,  PA-C 03/11/13 [phone]

## 2013-03-09 NOTE — ED Notes (Signed)
Per EMS:Pt from home; wife reports that he took a nap at 1300 and awoke at 1500 and pt no longer at baseline, unable to say what has changed. Pt's neuro intact, generalized weakness. Pt minimally verbal at baseline. AT 120's. 138/70. CBG 304. 16 RR. 85% on RA. Placed on 2L, increased to 93%.  Hx: Alzhemier's, dementia

## 2013-03-09 NOTE — ED Notes (Signed)
CRITICAL VALUE ALERT  Critical value received:  Potassium 6.7  Date of notification: 03/09/2013  Time of notification: 1837  Critical value read back: Yes  Nurse who received alert:  Christin BachBrittney Mikle Sternberg  MD notified (1st page):  MD Ward

## 2013-03-09 NOTE — ED Notes (Signed)
Pharmacist at bedside.

## 2013-03-09 NOTE — Progress Notes (Signed)
ANTIBIOTIC CONSULT NOTE - INITIAL  Pharmacy Consult for vancomycin and aztreonam Indication: rule out sepsis  Allergies  Allergen Reactions  . Penicillins Rash   Patient Measurements: Height: 6' (182.9 cm) Weight: 125 lb (56.7 kg) IBW/kg (Calculated) : 77.6  Vital Signs: Temp: 100.9 F (38.3 C) (03/05 1804) Temp src: Rectal (03/05 1804) BP: 138/61 mmHg (03/05 1815) Pulse Rate: 117 (03/05 1815)  Labs:  Recent Labs  03/09/13 1738  WBC 11.1*  HGB 13.0  PLT 175   Estimated Creatinine Clearance: 28.7 ml/min (by C-G formula based on Cr of 1.4). No results found for this basename: VANCOTROUGH, VANCOPEAK, VANCORANDOM, GENTTROUGH, GENTPEAK, GENTRANDOM, TOBRATROUGH, TOBRAPEAK, TOBRARND, AMIKACINPEAK, AMIKACINTROU, AMIKACIN,  in the last 72 hours   Microbiology: No results found for this or any previous visit (from the past 720 hour(s)).  Medical History: Past Medical History  Diagnosis Date  . Flashers or floaters, right eye   . Hyperlipidemia   . DJD of shoulder   . Aortic stenosis     severe sees Dr. Peter [place]  . Mitral insufficiency   . RBBB (right bundle branch block)   . LAFB (left anterior fascicular block)   . Shingles   . Diabetes mellitus   . Carotid artery disease     last dopplers on 05/23/09 were stable, LICA with 0-39% stenosis and RICA with 40-59%  . Acute arterial ischemic stroke, vertebrobasilar, thalamic 01/08  . Pneumonia 04/11  . Dementia     Medications:  Infusions:  . aztreonam    . vancomycin 1,000 mg (03/09/13 1759)   Assessment: 80 yo M presenting to ED with AMS, febrile, tachycardic, but normotensive.  Code sepsis called and pharmacy consulted to dose vancomycin and aztreonam in PCN allergic patient. Initial labs reveal lactic acid elevated at 3.17, WBC elevated at 11.1, and SCr 1.41 with estimated CrCl ~29. Patient received first time doses of vancomycin 1gm and aztreonam 2gm in the ED.  Goal of Therapy:  Vancomycin trough level 15-20  mcg/ml Resolution of possible infection  Plan:  - after vancomycin IV 1gm loading dose, start 500mg  q24h - continue aztreonam, but at 1gm q8h - monitor kidney function, WBC, fever curve, cultures, and clinical progress  Harrold Donathathan E. Achilles D[place]ope, PharmD Clinical Pharmacist - Resident Pager: [phone] Pharmacy: [phone] 03/09/2013 6:50 PM

## 2013-03-09 NOTE — ED Notes (Signed)
Pt's 02 dropped to 87% while sleeping on 4L [place]. 02 increased to 6L, O2 now 96%.

## 2013-03-09 NOTE — ED Notes (Signed)
Admitting MD Julian ReilGardner at bedside. Per wife, pt is a full code.

## 2013-03-09 NOTE — H&P (Signed)
[facility] History and Physical  Christian [name] KGU:[id number] DOB: Mar 28, 1921 DOA: 03/09/2013  Referring physician: EDP PCP: Laurey Morale, MD   Chief Complaint: AMS   HPI: Christian [name] is a 80 y.o. male who presents to the ED with AMS.  Symptoms onset this afternoon, was at baseline before he took a nap.  Family then notes after he woke up he was "more confused" than normal.  By the time he got to the ED he was noted to be hypoxic, febrile, tachycardic, and had developed cough.  Work up in ED shows early PNA, sepsis, hyperkalemia, AKI.  Patient is requiring a NRB to maintain O2 sats.  Review of Systems: Systems reviewed.  As above, otherwise negative  Past Medical History  Diagnosis Date  . Flashers or floaters, right eye   . Hyperlipidemia   . DJD of shoulder   . Aortic stenosis     severe sees Dr. Peter Martinique  . Mitral insufficiency   . RBBB (right bundle branch block)   . LAFB (left anterior fascicular block)   . Shingles   . Diabetes mellitus   . Carotid artery disease     last dopplers on 07/02/29 were stable, LICA with 5-17% stenosis and RICA with 40-59%  . Acute arterial ischemic stroke, vertebrobasilar, thalamic 01/08  . Pneumonia 04/11  . Dementia    Past Surgical History  Procedure Laterality Date  . Tonsillectomy    . Colonoscopy  11/04    clear no repeats needed  . Aortic valve replacement      used bovine valve in 2000 per Dr. Cyndia Bent  . Carotid endarterectomy  01/13/08    left per Dr. Kellie Simmering   Social History:  reports that he has quit smoking. He has never used smokeless tobacco. He reports that he does not drink alcohol or use illicit drugs.  Allergies  Allergen Reactions  . Penicillins Rash    No family history on file.   Prior to Admission medications   Medication Sig Start Date End Date Taking? Authorizing Provider  aspirin 325 MG tablet Take 325 mg by mouth daily.     Yes Historical Provider, MD  Blood Glucose Monitoring Suppl  (ACCU-CHEK AVIVA PLUS) W/DEVICE KIT Test once per day and diagnosis code is 250.00 01/27/13  Yes Laurey Morale, MD  donepezil (ARICEPT) 10 MG tablet Take 1 tablet (10 mg total) by mouth at bedtime as needed. 12/26/12  Yes Laurey Morale, MD  glucose blood (ACCU-CHEK AVIVA) test strip Test once per day 01/27/13  Yes Laurey Morale, MD  glyBURIDE (DIABETA) 5 MG tablet Take 1 tablet (5 mg total) by mouth 2 (two) times daily with a meal. 12/26/12  Yes Laurey Morale, MD  Lancets (ACCU-CHEK SOFT TOUCH) lancets Test once per day 01/27/13  Yes Laurey Morale, MD  lisinopril (PRINIVIL,ZESTRIL) 20 MG tablet Take 1 tablet (20 mg total) by mouth daily. 12/26/12  Yes Laurey Morale, MD  meclizine (ANTIVERT) 25 MG tablet Take 1 tablet (25 mg total) by mouth as needed for dizziness. FOR DIZZINESS 12/24/11  Yes Laurey Morale, MD  memantine (NAMENDA) 10 MG tablet Take 1 tablet (10 mg total) by mouth 2 (two) times daily. 12/26/12  Yes Laurey Morale, MD  metFORMIN (GLUCOPHAGE) 1000 MG tablet Take 1 tablet (1,000 mg total) by mouth 2 (two) times daily with a meal. 12/26/12  Yes Laurey Morale, MD  multivitamin-lutein [facility]) CAPS Take 1 capsule by mouth daily.  Yes Historical Provider, MD  silver sulfADIAZINE (SILVADENE) 1 % cream Apply topically 2 (two) times daily. 10/14/12  Yes Laurey Morale, MD  simvastatin (ZOCOR) 40 MG tablet Take 1 tablet (40 mg total) by mouth at bedtime. 12/26/12  Yes Laurey Morale, MD  sitaGLIPtin (JANUVIA) 100 MG tablet Take 1 tablet (100 mg total) by mouth daily. 12/26/12  Yes Laurey Morale, MD   Physical Exam: Filed Vitals:   03/09/13 2030  BP: 112/41  Pulse: 115  Temp:   Resp: 17    BP 112/41  Pulse 115  Temp(Src) 99 F (37.2 C) (Oral)  Resp 17  Ht 6' (1.829 m)  Wt 56.7 kg (125 lb)  BMI 16.95 kg/m2  SpO2 100%  General Appearance:    Alert, confused, no distress, appears stated age  Head:    Normocephalic, atraumatic  Eyes:    PERRL, EOMI, sclera non-icteric         Nose:   Nares without drainage or epistaxis. Mucosa, turbinates normal  Throat:   Moist mucous membranes. Oropharynx without erythema or exudate.  Neck:   Supple. No carotid bruits.  No thyromegaly.  No lymphadenopathy.   Back:     No CVA tenderness, no spinal tenderness  Lungs:     Cough noted, does have Rales RLL  Chest wall:    No tenderness to palpitation  Heart:    Regular rate and rhythm without murmurs, gallops, rubs  Abdomen:     Soft, non-tender, nondistended, normal bowel sounds, no organomegaly  Genitalia:    deferred  Rectal:    deferred  Extremities:   No clubbing, cyanosis or edema.  Pulses:   2+ and symmetric all extremities  Skin:   Skin color, texture, turgor normal, no rashes or lesions  Lymph nodes:   Cervical, supraclavicular, and axillary nodes normal  Neurologic:   CNII-XII intact. Normal strength, sensation and reflexes      throughout    Labs on Admission:  Basic Metabolic Panel:  Recent Labs Lab 03/09/13 1738  NA 141  K 6.7*  CL 103  CO2 22  GLUCOSE 220*  BUN 49*  CREATININE 1.41*  CALCIUM 9.9   Liver Function Tests:  Recent Labs Lab 03/09/13 1738  AST 17  ALT 11  ALKPHOS 117  BILITOT 0.3  PROT 7.6  ALBUMIN 3.9   No results found for this basename: LIPASE, AMYLASE,  in the last 168 hours No results found for this basename: AMMONIA,  in the last 168 hours CBC:  Recent Labs Lab 03/09/13 1738  WBC 11.1*  NEUTROABS 10.0*  HGB 13.0  HCT 39.4  MCV 92.1  PLT 175   Cardiac Enzymes: No results found for this basename: CKTOTAL, CKMB, CKMBINDEX, TROPONINI,  in the last 168 hours  BNP (last 3 results) No results found for this basename: PROBNP,  in the last 8760 hours CBG: No results found for this basename: GLUCAP,  in the last 168 hours  Radiological Exams on Admission: Dg Chest Port 1 View  03/09/2013   CLINICAL DATA:  Cough and congestion. Chills. Altered mental status.  EXAM: PORTABLE CHEST - 1 VIEW  COMPARISON:  Two-view chest  x-ray 01/03/2011.  FINDINGS: Heart size is normal. Atherosclerotic calcifications are again noted. Fused perihilar bronchitic changes are again seen. Superimposed ill-defined airspace disease is present at the lung bases, right greater than left. The visualized soft tissues and bony thorax are unremarkable.  IMPRESSION: 1. Acute on chronic perihilar bronchitic changes bilaterally. 2.  Superimposed she right lower lobe airspace disease is concerning for early pneumonia. 3. Atherosclerosis.   Electronically Signed   By: Lawrence Santiago M.D.   On: 03/09/2013 18:14    EKG: Independently reviewed. Tall peaked T waves noted.  Assessment/Plan Principal Problem:   HCAP (healthcare-associated pneumonia) Active Problems:   DIABETES MELLITUS, TYPE II   Severe sepsis with acute organ dysfunction   Hyperkalemia   AKI (acute kidney injury)   1. HCAP - patient on aztreonam and vanc for HCAP, significant O2 requirement but thankfully satting well in NRB at this time. 2. Severe sepsis - with AKI, IVF at 125 cc/hr sepsis due to #1 above, tylenol for fever 3. AKI - pre-renal due to sepsis, rehydrating, holding lisinopril, strict intake and output 4. Hyperkalemia - temporarizing measures including insulin, D50, albuterol have been given.  Adding kayexelate.  Repeat BMP at midnight.  Also adding an amp of calcium gluconate. 5. DM2 - put him on clear liquid but given his O2 requirement doubt he will be taking POs much for the next 24 hours or so, D/C'd home oral meds and put him on low dose SSI q4h.    Code Status: Full Code per wife Family Communication: No family in room at this time Disposition Plan: Admit to SDU   Time spent: 70 min  Antonella Upson M. [facility] Pager [phone]  If 7AM-7PM, please contact the day team taking care of the patient [email or web address] Password [facility] 03/09/2013, 8:42 PM

## 2013-03-09 NOTE — ED Notes (Signed)
Pt  O2 remains at 89% on 6L after 5 of albuterol. PA made aware. Resp at bedside evaluating.

## 2013-03-09 NOTE — ED Notes (Signed)
Wife, Dia SitterLera, [phone]

## 2013-03-09 NOTE — ED Notes (Signed)
Verbal order given by brittany-rn to in and out cath patient.

## 2013-03-09 NOTE — ED Notes (Signed)
Pt placed on NRB. Increased to 94%.

## 2013-03-09 NOTE — ED Notes (Signed)
Wife now at bedside. States "he hasn't been eating well at all today. And then when he woke up from his nap he was shaking, and said he was cold. He just wasn't acting like himself." Wife states pt still doesn't quite seem like himself. Pt remains minimally verbal, but states "no" when asked if he has any pain.

## 2013-03-09 NOTE — ED Provider Notes (Signed)
Medical screening examination/treatment/procedure(s) were conducted as a shared visit with non-physician practitioner(s) and myself.  I personally evaluated the patient during the encounter.   EKG Interpretation  Date/Time:  Thursday March 09 2013 17:24:21 EST Ventricular Rate:  123 PR Interval:  141 QRS Duration: 122 QT Interval:  323 QTC Calculation: 462 R Axis:   -93 Text Interpretation:  Sinus tachycardia RBBB and LAFB Probable lateral infarct, age indeterminate ST elevation, consider inferior injury No significant change since last tracing Confirmed by WARD,  DO, KRISTEN ([id number]) on 03/09/2013 6:13:34 PM      Pt is an 80 y.o. M with a history of Alzheimer's dementia, diabetes, hyperlipidemia, CAD, aortic stenosis who presents emergency department with altered mental status. Patient is febrile and tachycardic but normotensive. He is a new oxygen requirement. He denies having any pain currently and is in no apparent distress. Family reports that earlier this afternoon prior to taking a nap he was at his baseline. They feel he is more confused than normal. No focal neurologic deficits on exam. We'll obtain sepsis workup and give broad-spectrum antibiotics, IV fluids. Patient will need admission.  Pt has RLL PNA.  Vitals improving.  Pt admitted.    CRITICAL CARE Performed by: Raelyn NumberWARD, KRISTEN N   Total critical care time: 30 minutes  Critical care time was exclusive of separately billable procedures and treating other patients.  Critical care was necessary to treat or prevent imminent or life-threatening deterioration.  Critical care was time spent personally by me on the following activities: development of treatment plan with patient and/or surrogate as well as nursing, discussions with consultants, evaluation of patient's response to treatment, examination of patient, obtaining history from patient or surrogate, ordering and performing treatments and interventions, ordering and review of  laboratory studies, ordering and review of radiographic studies, pulse oximetry and re-evaluation of patient's condition.    Layla MawKristen N Ward, DO 03/09/13 2140

## 2013-03-09 NOTE — ED Notes (Signed)
Discussed with PC Lawyer pt's decreasing BP, the change in 02 level and the critical potassium level. Pt remains at baseline. Family at bedside.

## 2013-03-10 ENCOUNTER — Encounter (HOSPITAL_COMMUNITY): Payer: Self-pay | Admitting: *Deleted

## 2013-03-10 DIAGNOSIS — E43 Unspecified severe protein-calorie malnutrition: Secondary | ICD-10-CM

## 2013-03-10 DIAGNOSIS — F0390 Unspecified dementia without behavioral disturbance: Secondary | ICD-10-CM

## 2013-03-10 DIAGNOSIS — E875 Hyperkalemia: Secondary | ICD-10-CM

## 2013-03-10 DIAGNOSIS — G929 Unspecified toxic encephalopathy: Secondary | ICD-10-CM

## 2013-03-10 DIAGNOSIS — G92 Toxic encephalopathy: Secondary | ICD-10-CM

## 2013-03-10 LAB — CBC
HCT: 29 % — ABNORMAL LOW (ref 39.0–52.0)
Hemoglobin: 9.5 g/dL — ABNORMAL LOW (ref 13.0–17.0)
MCH: 29.6 pg (ref 26.0–34.0)
MCHC: 32.8 g/dL (ref 30.0–36.0)
MCV: 90.3 fL (ref 78.0–100.0)
PLATELETS: 137 10*3/uL — AB (ref 150–400)
RBC: 3.21 MIL/uL — AB (ref 4.22–5.81)
RDW: 14.3 % (ref 11.5–15.5)
WBC: 13.8 10*3/uL — ABNORMAL HIGH (ref 4.0–10.5)

## 2013-03-10 LAB — URINE CULTURE
Colony Count: NO GROWTH
Culture: NO GROWTH

## 2013-03-10 LAB — BASIC METABOLIC PANEL
BUN: 43 mg/dL — ABNORMAL HIGH (ref 6–23)
BUN: 44 mg/dL — ABNORMAL HIGH (ref 6–23)
CHLORIDE: 111 meq/L (ref 96–112)
CO2: 20 meq/L (ref 19–32)
CO2: 21 mEq/L (ref 19–32)
CREATININE: 1.28 mg/dL (ref 0.50–1.35)
Calcium: 8.7 mg/dL (ref 8.4–10.5)
Calcium: 8.9 mg/dL (ref 8.4–10.5)
Chloride: 107 mEq/L (ref 96–112)
Creatinine, Ser: 1.31 mg/dL (ref 0.50–1.35)
GFR calc Af Amer: 54 mL/min — ABNORMAL LOW (ref >90)
GFR calc non Af Amer: 47 mL/min — ABNORMAL LOW (ref >90)
GFR, EST AFRICAN AMERICAN: 55 mL/min — AB (ref >90)
GFR, EST NON AFRICAN AMERICAN: 48 mL/min — AB (ref >90)
Glucose, Bld: 205 mg/dL — ABNORMAL HIGH (ref 70–99)
Glucose, Bld: 213 mg/dL — ABNORMAL HIGH (ref 70–99)
POTASSIUM: 4.9 meq/L (ref 3.7–5.3)
Potassium: 4.9 mEq/L (ref 3.7–5.3)
Sodium: 142 mEq/L (ref 137–147)
Sodium: 144 mEq/L (ref 137–147)

## 2013-03-10 LAB — LEGIONELLA ANTIGEN, URINE: Legionella Antigen, Urine: NEGATIVE

## 2013-03-10 LAB — STREP PNEUMONIAE URINARY ANTIGEN: STREP PNEUMO URINARY ANTIGEN: POSITIVE — AB

## 2013-03-10 LAB — GLUCOSE, CAPILLARY
GLUCOSE-CAPILLARY: 178 mg/dL — AB (ref 70–99)
GLUCOSE-CAPILLARY: 118 mg/dL — AB (ref 70–99)
Glucose-Capillary: 132 mg/dL — ABNORMAL HIGH (ref 70–99)
Glucose-Capillary: 156 mg/dL — ABNORMAL HIGH (ref 70–99)
Glucose-Capillary: 85 mg/dL (ref 70–99)

## 2013-03-10 LAB — HIV ANTIBODY (ROUTINE TESTING W REFLEX): HIV: NONREACTIVE

## 2013-03-10 MED ORDER — VANCOMYCIN HCL IN DEXTROSE 750-5 MG/150ML-% IV SOLN
750.0000 mg | INTRAVENOUS | Status: DC
  Administered 2013-03-10 – 2013-03-11 (×2): 750 mg via INTRAVENOUS
  Filled 2013-03-10 (×3): qty 150

## 2013-03-10 MED ORDER — BUDESONIDE 0.25 MG/2ML IN SUSP
0.2500 mg | Freq: Two times a day (BID) | RESPIRATORY_TRACT | Status: DC
  Administered 2013-03-10 – 2013-03-15 (×10): 0.25 mg via RESPIRATORY_TRACT
  Filled 2013-03-10 (×14): qty 2

## 2013-03-10 MED ORDER — BOOST / RESOURCE BREEZE PO LIQD
1.0000 | Freq: Three times a day (TID) | ORAL | Status: DC
  Administered 2013-03-10 – 2013-03-16 (×17): 1 via ORAL

## 2013-03-10 MED ORDER — OSELTAMIVIR PHOSPHATE 75 MG PO CAPS: 75.0000 mg | ORAL_CAPSULE | Freq: Two times a day (BID) | ORAL | Status: DC

## 2013-03-10 MED ORDER — OSELTAMIVIR PHOSPHATE 30 MG PO CAPS
30.0000 mg | ORAL_CAPSULE | Freq: Two times a day (BID) | ORAL | Status: DC
  Administered 2013-03-10 – 2013-03-12 (×5): 30 mg via ORAL
  Filled 2013-03-10 (×6): qty 1

## 2013-03-10 MED ORDER — GUAIFENESIN ER 600 MG PO TB12
600.0000 mg | ORAL_TABLET | Freq: Two times a day (BID) | ORAL | Status: DC
  Administered 2013-03-10 – 2013-03-12 (×5): 600 mg via ORAL
  Filled 2013-03-10 (×6): qty 1

## 2013-03-10 NOTE — Progress Notes (Addendum)
[facility] PROGRESS NOTE  Christian [name] ZOX:[id number] DOB: 1921/11/23 DOA: 03/09/2013 PCP: [name], MD  Assessment/Plan: 1-Sepsis due to HCAP: -improving. -continue IV antibiotics (vanc and cefepime) -will start on tamiflu and check influenza panel -start pulmicort and add mucinex -PRN oxygen supplementation -will start flutter valve -follow clinical response  2-toxic encephalopathy -resolving -due to high temp and acute infection -PRN antipyretics -continue tx as mentioned above  3-AKI: due to dehydration -will continue IVF's -Cr responding  -no signs or symptoms of fluid overload  4-1/2 positive BC for cocci in pairs -on vancomycin -will wait speciation  5-DM2: continue SSI  6-hyperkalemia: resolved -will monitor  7-severe protein calorie malnutrition: will add nutrition consult  and follow rec's for feeding supplements.  8-dementia: continue aricept/namenda and supportive care  WJX:[id number]   Code Status: Full Family Communication: wife at bedside Disposition Plan: to be determine    Consultants:  None   Procedures:  See below for x-ray reports   Antibiotics:  vanc and cefepime   HPI/Subjective: Febrile overnight; but no fever currently; denies CP. Patient now AAOX3, complaining of being unable to expectorate when coughing and some SOB.  Objective: Filed Vitals:   03/10/13 1205  BP: 112/48  Pulse: 65  Temp: 98 F (36.7 C)  Resp: 11    Intake/Output Summary (Last 24 hours) at 03/10/13 1439 Last data filed at 03/10/13 1100  Gross per 24 hour  Intake 2037.83 ml  Output      0 ml  Net 2037.83 ml   Filed Weights   03/09/13 1815 03/09/13 2150  Weight: 56.7 kg (125 lb) 60.7 kg (133 lb 13.1 oz)    Exam:   General:  NAD, febrile overnight; AAOx3  Cardiovascular: S1 and S2, no rubs or gallops  Respiratory: diffuse rhonchi, positive exp wheezing  Abdomen: soft, NT, ND, positive BS  Musculoskeletal: no edema, no  cyanosis   Data Reviewed: Basic Metabolic Panel:  Recent Labs Lab 03/09/13 1738 03/10/13 0020 03/10/13 0258  NA 141 142 144  K 6.7* 4.9 4.9  CL 103 107 111  CO2 22 21 20   GLUCOSE 220* 213* 205*  BUN 49* 44* 43*  CREATININE 1.41* 1.28 1.31  CALCIUM 9.9 8.9 8.7   Liver Function Tests:  Recent Labs Lab 03/09/13 1738  AST 17  ALT 11  ALKPHOS 117  BILITOT 0.3  PROT 7.6  ALBUMIN 3.9   CBC:  Recent Labs Lab 03/09/13 1738 03/10/13 0258  WBC 11.1* 13.8*  NEUTROABS 10.0*  --   HGB 13.0 9.5*  HCT 39.4 29.0*  MCV 92.1 90.3  PLT 175 137*   CBG:  Recent Labs Lab 03/09/13 2109 03/09/13 2336 03/10/13 0341 03/10/13 0731 03/10/13 1314  GLUCAP 219* 184* 178* 132* 156*    Recent Results (from the past 240 hour(s))  CULTURE, BLOOD (ROUTINE X 2)     Status: None   Collection Time    03/09/13  5:45 PM      Result Value Ref Range Status   Specimen Description BLOOD WRIST RIGHT   Final   Special Requests BOTTLES DRAWN AEROBIC AND ANAEROBIC 5CC   Final   Culture  Setup Time     Final   Value: 03/10/2013 00:13     Performed at [facility]   Culture     Final   Value: GRAM POSITIVE COCCI IN PAIRS     Note: Gram Stain Report Called to,Read Back By and Verified With: [name]@1 :17PM ON 03/10/13 BY DANTS  [facility]   Report Status PENDING   Incomplete  MRSA PCR SCREENING     Status: None   Collection Time    03/09/13  9:53 PM      Result Value Ref Range Status   MRSA by PCR NEGATIVE  NEGATIVE Final   Comment:            The GeneXpert MRSA Assay (FDA     approved for NASAL specimens     only), is one component of a     comprehensive MRSA colonization     surveillance program. It is not     intended to diagnose MRSA     infection nor to guide or     monitor treatment for     MRSA infections.     Studies: Dg Chest Port 1 View  03/09/2013   CLINICAL DATA:  Cough and congestion. Chills. Altered mental status.  EXAM: PORTABLE  CHEST - 1 VIEW  COMPARISON:  Two-view chest x-ray 01/03/2011.  FINDINGS: Heart size is normal. Atherosclerotic calcifications are again noted. Fused perihilar bronchitic changes are again seen. Superimposed ill-defined airspace disease is present at the lung bases, right greater than left. The visualized soft tissues and bony thorax are unremarkable.  IMPRESSION: 1. Acute on chronic perihilar bronchitic changes bilaterally. 2. Superimposed she right lower lobe airspace disease is concerning for early pneumonia. 3. Atherosclerosis.   Electronically Signed   By: Gennette Pachris  Mattern M.D.   On: 03/09/2013 18:14    Scheduled Meds: . aspirin  325 mg Oral Daily  . aztreonam  1 g Intravenous Q8H  . budesonide (PULMICORT) nebulizer solution  0.25 mg Nebulization BID  . donepezil  10 mg Oral QHS  . feeding supplement (RESOURCE BREEZE)  1 Container Oral TID BM  . guaiFENesin  600 mg Oral BID  . heparin  5,000 Units Subcutaneous 3 times per day  . insulin aspart  0-9 Units Subcutaneous 6 times per day  . memantine  10 mg Oral BID  . multivitamin-lutein  1 capsule Oral Daily  . oseltamivir  30 mg Oral BID  . simvastatin  40 mg Oral QHS  . vancomycin  750 mg Intravenous Q24H   Continuous Infusions: . sodium chloride 1,000 mL (03/10/13 1013)    Time spent: >30 minutes   Armen PickupManzueta, Azeem Poorman E Jakeob Tullis  [facility] Pager [phone]. If 7PM-7AM, please contact night-coverage at [email or web address], password [email or web address] 03/10/2013, 2:39 PM  LOS: 1 day

## 2013-03-10 NOTE — Progress Notes (Signed)
Utilization Review Completed.  

## 2013-03-10 NOTE — Progress Notes (Signed)
Blood culture aerobic bottle positive for gram positive cocci in pairs. Dr. Gwenlyn PerkingMadera on unit and made aware.

## 2013-03-10 NOTE — Progress Notes (Signed)
INITIAL NUTRITION ASSESSMENT  DOCUMENTATION CODES Per approved criteria  -Severe malnutrition in the context of chronic illness -Underweight   INTERVENTION: Resource Breeze po TID, each supplement provides 250 kcal and 9 grams of protein RD to follow for nutrition care plan  NUTRITION DIAGNOSIS: Increased nutrient needs related to malnutrition, repletion as evidenced by estimated nutrition needs  Goal: Pt to meet >/= 90% of their estimated nutrition needs   Monitor:  PO & supplemental intake, weight, labs, I/O's  Reason for Assessment: Malnutrition Screening Tool Report  80 y.o. male  Admitting Dx: HCAP (healthcare-associated pneumonia)  ASSESSMENT: 80 y.o. male who presents to the ED with AMS. Symptoms onset this afternoon, was at baseline before he took a nap. Family then notes after he woke up he was "more confused" than normal. By the time he got to the ED he was noted to be hypoxic, febrile, tachycardic, and had developed cough. Work up in ED shows early PNA, sepsis, hyperkalemia, AKI. Patient is requiring a NRB to maintain O2 sats.  RD unable to interview pt at this time; sleeping soundly; no family at bedside; + visible muscle & subcutaneous fat loss to upper body; pt currently on Clear Liquids; RD questions whether pt has trouble chewing and or swallowing; per weight readings, pt has had progressive weight loss since September 2014 (8%); would benefit from addition of nutrition supplements -- will order.  Nutrition Focused Physical Exam:  Subcutaneous Fat:  Orbital Region: moderate epletion Upper Arm Region: severe depletion Thoracic and Lumbar Region: N/A  Muscle:  Temple Region: moderate depletion Clavicle Bone Region: severe depletion Clavicle and Acromion Bone Region: severe depletion Scapular Bone Region: N/A Dorsal Hand: N/A Patellar Region: N/A Anterior Thigh Region: N/A Posterior Calf Region: N/A  Edema: none  Patient meets criteria for severe  malnutrition in the context of chronic illness as evidenced by severe muscle loss and severe subcutaneous fat loss.  Height: Ht Readings from Last 1 Encounters:  03/09/13 6' (1.829 m)    Weight: Wt Readings from Last 1 Encounters:  03/09/13 133 lb 13.1 oz (60.7 kg)    Ideal Body Weight: 178 lb  % Ideal Body Weight: 75%  Wt Readings from Last 10 Encounters:  03/09/13 133 lb 13.1 oz (60.7 kg)  12/26/12 125 lb (56.7 kg)  09/29/12 144 lb (65.318 kg)  06/08/12 149 lb (67.586 kg)  01/25/12 168 lb (76.204 kg)  12/24/11 169 lb (76.658 kg)  01/05/11 160 lb 7.9 oz (72.8 kg)  12/08/10 169 lb (76.658 kg)  12/03/10 171 lb 12.8 oz (77.928 kg)  08/28/10 170 lb (77.111 kg)    Usual Body Weight: 144 lb -- September 2014  % Usual Body Weight: 92%  BMI:  Body mass index is 18.15 kg/(m^2).  Estimated Nutritional Needs: Kcal: 1600-1800 Protein: 80-90 gm Fluid: 1.6-1.8 L  Skin: Intact  Diet Order: Clear Liquid  EDUCATION NEEDS: -No education needs identified at this time   Intake/Output Summary (Last 24 hours) at 03/10/13 0936 Last data filed at 03/10/13 0700  Gross per 24 hour  Intake 1412.83 ml  Output      0 ml  Net 1412.83 ml    Labs:   Recent Labs Lab 03/09/13 1738 03/10/13 0020 03/10/13 0258  NA 141 142 144  K 6.7* 4.9 4.9  CL 103 107 111  CO2 22 21 20   BUN 49* 44* 43*  CREATININE 1.41* 1.28 1.31  CALCIUM 9.9 8.9 8.7  GLUCOSE 220* 213* 205*    CBG (last 3)  Recent Labs  03/09/13 2336 03/10/13 0341 03/10/13 0731  GLUCAP 184* 178* 132*    Scheduled Meds: . aspirin  325 mg Oral Daily  . aztreonam  1 g Intravenous Q8H  . donepezil  10 mg Oral QHS  . heparin  5,000 Units Subcutaneous 3 times per day  . insulin aspart  0-9 Units Subcutaneous 6 times per day  . memantine  10 mg Oral BID  . multivitamin-lutein  1 capsule Oral Daily  . oseltamivir  30 mg Oral BID  . simvastatin  40 mg Oral QHS  . vancomycin  500 mg Intravenous Q24H     Continuous Infusions: . sodium chloride 125 mL/hr at 03/09/13 2050    Past Medical History  Diagnosis Date  . Flashers or floaters, right eye   . Hyperlipidemia   . DJD of shoulder   . Aortic stenosis     severe sees Dr. Peter Swazil[place]  . Mitral insufficiency   . RBBB (right bundle branch block)   . LAFB (left anterior fascicular block)   . Shingles   . Diabetes mellitus   . Carotid artery disease     last dopplers on 05/23/09 were stable, LICA with 0-39% stenosis and RICA with 40-59%  . Acute arterial ischemic stroke, vertebrobasilar, thalamic 01/08  . Pneumonia 04/11  . Dementia     Past Surgical History  Procedure Laterality Date  . Tonsillectomy    . Colonoscopy  11/04    clear no repeats needed  . Aortic valve replacement      used bovine valve in 2000 per Dr. Laneta SimmersBartle  . Carotid endarterectomy  01/13/08    left per Dr. Marco CollieLawson    Katie Kathleen Likins, RD, LDN Pager #: [phone] After-Hours Pager #: [phone]

## 2013-03-11 DIAGNOSIS — R1314 Dysphagia, pharyngoesophageal phase: Secondary | ICD-10-CM | POA: Diagnosis present

## 2013-03-11 LAB — GLUCOSE, CAPILLARY
GLUCOSE-CAPILLARY: 142 mg/dL — AB (ref 70–99)
GLUCOSE-CAPILLARY: 171 mg/dL — AB (ref 70–99)
GLUCOSE-CAPILLARY: 174 mg/dL — AB (ref 70–99)
GLUCOSE-CAPILLARY: 271 mg/dL — AB (ref 70–99)
GLUCOSE-CAPILLARY: 67 mg/dL — AB (ref 70–99)
Glucose-Capillary: 58 mg/dL — ABNORMAL LOW (ref 70–99)
Glucose-Capillary: 271 mg/dL — ABNORMAL HIGH (ref 70–99)
Glucose-Capillary: 124 mg/dL — ABNORMAL HIGH (ref 70–99)

## 2013-03-11 MED ORDER — RESOURCE THICKENUP CLEAR PO POWD
ORAL | Status: DC | PRN
  Filled 2013-03-11: qty 125

## 2013-03-11 MED ORDER — STARCH (THICKENING) PO POWD
ORAL | Status: DC | PRN
  Filled 2013-03-11 (×2): qty 227

## 2013-03-11 NOTE — Progress Notes (Signed)
[facility] PROGRESS NOTE  Christian ElseDavid J [name] UJW:[id number] DOB: 1921-05-18 DOA: 03/09/2013 PCP: Nelwyn SalisburyFRY,Christian A, MD  Interim summary:  Patient is an 80 year old white male with past medical history of severe aortic stenosis, mild dementia and diabetes mellitus who presented to the emergency room on 3/5 evening w/ altered mental status and lethargy and signs consistent with sepsis secondary to pneumonia. He was started on broad-spectrum antibiotics. Flu titer negative.  Speech evaluation done 3/7 noted significant dysphagia and diet downgraded to dysphagia 3 with nectar thick liquids.  Assessment/Plan: 1-Sepsis due to HCAP: Overall improving. Rectal cell count increased today, likely due to recurrent aspiration. Confirmed by swallow eval, so diet downgraded which should help. Blood cultures positive for Streptococcus species, awaiting further growth and sensitivities. Stable overall, transfer to floor.   2-toxic encephalopathy -resolving -due to high temp and acute infection -PRN antipyretics -continue tx as mentioned above  3-AKI: due to dehydration -will continue IVF's -Cr responding  -no signs or symptoms of fluid overload  4-1/2 positive BC for Streptococcus -on vancomycin -will wait speciation  5-DM2: continue SSI  6-hyperkalemia: resolved -will monitor  7-severe protein calorie malnutrition: These criteria for severe malnutrition the context of chronic illness as evidenced by severe muscle loss and severe subcutaneous fat loss. Resource breeze 3 times a day added  8-dementia: continue aricept/namenda and supportive care  FAO:ZHYQMVHVT:heparin   Code Status: Full Family Communication: Left message for wife Disposition Plan: Diet downgraded. Transfer to floor   Consultants:  None   Procedures:  See below for x-ray reports   Antibiotics:  vanc and cefepime   HPI/Subjective: No complaints. Patient states breathing is okay. Still some coughing.  Confused.  Objective: Filed Vitals:   03/11/13 1400  BP: 94/72  Pulse: 92  Temp:   Resp: 16    Intake/Output Summary (Last 24 hours) at 03/11/13 1547 Last data filed at 03/11/13 1500  Gross per 24 hour  Intake   2896 ml  Output    925 ml  Net   1971 ml   Filed Weights   03/09/13 1815 03/09/13 2150  Weight: 56.7 kg (125 lb) 60.7 kg (133 lb 13.1 oz)    Exam:   General:  NAD, febrile overnight; mild confusion  Cardiovascular: S1 and S2, no rubs or gallops  Respiratory: diffuse rhonchi, bilateral end expiratory wheeze  Abdomen: soft, NT, ND, positive BS  Musculoskeletal: no edema, no cyanosis   Data Reviewed: Basic Metabolic Panel:  Recent Labs Lab 03/09/13 1738 03/10/13 0020 03/10/13 0258  NA 141 142 144  K 6.7* 4.9 4.9  CL 103 107 111  CO2 22 21 20   GLUCOSE 220* 213* 205*  BUN 49* 44* 43*  CREATININE 1.41* 1.28 1.31  CALCIUM 9.9 8.9 8.7   Liver Function Tests:  Recent Labs Lab 03/09/13 1738  AST 17  ALT 11  ALKPHOS 117  BILITOT 0.3  PROT 7.6  ALBUMIN 3.9   CBC:  Recent Labs Lab 03/09/13 1738 03/10/13 0258  WBC 11.1* 13.8*  NEUTROABS 10.0*  --   HGB 13.0 9.5*  HCT 39.4 29.0*  MCV 92.1 90.3  PLT 175 137*   CBG:  Recent Labs Lab 03/11/13 0024 03/11/13 0438 03/11/13 0516 03/11/13 0733 03/11/13 1143  GLUCAP 171* 58* 142* 271* 271*    Recent Results (from the past 240 hour(s))  CULTURE, BLOOD (ROUTINE X 2)     Status: None   Collection Time    03/09/13  5:42 PM      Result Value  Ref Range Status   Specimen Description BLOOD LEFT ANTECUBITAL   Final   Special Requests BOTTLES DRAWN AEROBIC AND ANAEROBIC 5CC   Final   Culture  Setup Time     Final   Value: 03/10/2013 00:14     Performed at [facility]   Culture     Final   Value:        BLOOD CULTURE RECEIVED NO GROWTH TO DATE CULTURE WILL BE HELD FOR 5 DAYS BEFORE ISSUING A FINAL NEGATIVE REPORT     Performed at [facility]   Report Status PENDING    Incomplete  CULTURE, BLOOD (ROUTINE X 2)     Status: None   Collection Time    03/09/13  5:45 PM      Result Value Ref Range Status   Specimen Description BLOOD WRIST RIGHT   Final   Special Requests BOTTLES DRAWN AEROBIC AND ANAEROBIC 5CC   Final   Culture  Setup Time     Final   Value: 03/10/2013 00:13     Performed at [facility]   Culture     Final   Value: STREPTOCOCCUS SPECIES     Note: Gram Stain Report Called to,Read Back By and Verified With: Christian [name]@1 :17PM ON 03/10/13 BY DANTS     Performed at [facility]   Report Status PENDING   Incomplete  URINE CULTURE     Status: None   Collection Time    03/09/13  6:44 PM      Result Value Ref Range Status   Specimen Description URINE, CATHETERIZED   Final   Special Requests NONE   Final   Culture  Setup Time     Final   Value: 03/10/2013 01:12     Performed at [facility] Count     Final   Value: NO GROWTH     Performed at [facility]   Culture     Final   Value: NO GROWTH     Performed at [facility]   Report Status 03/10/2013 FINAL   Final  MRSA PCR SCREENING     Status: None   Collection Time    03/09/13  9:53 PM      Result Value Ref Range Status   MRSA by PCR NEGATIVE  NEGATIVE Final   Comment:            The GeneXpert MRSA Assay (FDA     approved for NASAL specimens     only), is one component of a     comprehensive MRSA colonization     surveillance program. It is not     intended to diagnose MRSA     infection nor to guide or     monitor treatment for     MRSA infections.     Studies: Dg Chest Port 1 View  03/09/2013   CLINICAL DATA:  Cough and congestion. Chills. Altered mental status.  EXAM: PORTABLE CHEST - 1 VIEW  COMPARISON:  Two-view chest x-ray 01/03/2011.  FINDINGS: Heart size is normal. Atherosclerotic calcifications are again noted. Fused perihilar bronchitic changes are again seen. Superimposed ill-defined airspace disease is present at the  lung bases, right greater than left. The visualized soft tissues and bony thorax are unremarkable.  IMPRESSION: 1. Acute on chronic perihilar bronchitic changes bilaterally. 2. Superimposed she right lower lobe airspace disease is concerning for early pneumonia. 3. Atherosclerosis.   Electronically Signed   By:  Gennette Pac M.D.   On: 03/09/2013 18:14    Scheduled Meds: . aspirin  325 mg Oral Daily  . aztreonam  1 g Intravenous Q8H  . budesonide (PULMICORT) nebulizer solution  0.25 mg Nebulization BID  . donepezil  10 mg Oral QHS  . feeding supplement (RESOURCE BREEZE)  1 Container Oral TID BM  . guaiFENesin  600 mg Oral BID  . heparin  5,000 Units Subcutaneous 3 times per day  . insulin aspart  0-9 Units Subcutaneous 6 times per day  . memantine  10 mg Oral BID  . multivitamin-lutein  1 capsule Oral Daily  . oseltamivir  30 mg Oral BID  . simvastatin  40 mg Oral QHS  . vancomycin  750 mg Intravenous Q24H   Continuous Infusions: . sodium chloride 20 mL/hr at 03/11/13 1212    Time spent: Greater than 30 minutes   Hollice Espy  [facility] Pager [phone]. If 7PM-7AM, please contact night-coverage at [email or web address], password [email or web address] 03/11/2013, 3:47 PM  LOS: 2 days

## 2013-03-11 NOTE — Evaluation (Signed)
Clinical/Bedside Swallow Evaluation Patient Details  Name: Christian [name] MRN: [id number] Date of Birth: 02/10/1921  Today's Date: 03/11/2013 Time: 4098-1191 SLP Time Calculation (min): 24 min  Past Medical History:  Past Medical History  Diagnosis Date  . Flashers or floaters, right eye   . Hyperlipidemia   . DJD of shoulder   . Aortic stenosis     severe sees Dr. Peter [place]  . Mitral insufficiency   . RBBB (right bundle branch block)   . LAFB (left anterior fascicular block)   . Shingles   . Diabetes mellitus   . Carotid artery disease     last dopplers on 05/23/09 were stable, LICA with 0-39% stenosis and RICA with 40-59%  . Acute arterial ischemic stroke, vertebrobasilar, thalamic 01/08  . Pneumonia 04/11  . Dementia    Past Surgical History:  Past Surgical History  Procedure Laterality Date  . Tonsillectomy    . Colonoscopy  11/04    clear no repeats needed  . Aortic valve replacement      used bovine valve in 2000 per Dr. Laneta Simmers  . Carotid endarterectomy  01/13/08    left per Dr. Hart [place]   HPI:  Christian [name] is a 80 y.o. male who presents to the ED with AMS. Symptoms onset this afternoon, was at baseline before he took a nap. Family then notes after he woke up he was "more confused" than normal. By the time he got to the ED he was noted to be hypoxic, febrile, tachycardic, and had developed cough. CXR shows superimposed right LL airspace disease.   Assessment / Plan / Recommendation Clinical Impression  Pt demonstrates consistent and overt evidence of aspiration with all sips of thin liquids. Upon questioning his son admits he often chokes at meals. Since lunch and PO trials with SLP, pt has become increasingly congested. Suspect pt with chronic aspiration prior to admit, worsened with respiratory decline. Pt demosntrated improved tolerance of nectar thick liquids and managed purees and solids well. Recommend downgrade to nectar thick liquids and dys 3 (mechanical soft  solids). If any further choking noted pt should be made NPO. Pt may also need MBS to objectively assess swallow function on Monday.     Aspiration Risk  Moderate    Diet Recommendation Dysphagia 3 (Mechanical Soft);Nectar-thick liquid   Liquid Administration via: Cup;No straw Medication Administration: Whole meds with puree Supervision: Patient able to self feed;Staff to assist with self feeding Compensations: Slow rate;Small sips/bites Postural Changes and/or Swallow Maneuvers: Seated upright 90 degrees;Upright 30-60 min after meal    Other  Recommendations Recommended Consults: MBS Oral Care Recommendations: Oral care BID Other Recommendations: Order thickener from pharmacy   Follow Up Recommendations  Home health SLP    Frequency and Duration min 2x/week  2 weeks   Pertinent Vitals/Pain NA    SLP Swallow Goals     Swallow Study Prior Functional Status       General HPI: Christian [name] is a 80 y.o. male who presents to the ED with AMS. Symptoms onset this afternoon, was at baseline before he took a nap. Family then notes after he woke up he was "more confused" than normal. By the time he got to the ED he was noted to be hypoxic, febrile, tachycardic, and had developed cough. CXR shows superimposed right LL airspace disease. Type of Study: Bedside swallow evaluation Previous Swallow Assessment: none in chart Diet Prior to this Study: Thin liquids Temperature Spikes Noted: No Respiratory Status: Room  air History of Recent Intubation: No Behavior/Cognition: Alert;Cooperative;Pleasant mood;Confused;Requires cueing Oral Cavity - Dentition:  (partial on top) Self-Feeding Abilities: Able to feed self;Needs set up Patient Positioning: Upright in bed Baseline Vocal Quality: Clear Volitional Cough: Congested Volitional Swallow: Able to elicit    Oral/Motor/Sensory Function Overall Oral Motor/Sensory Function: Appears within functional limits for tasks assessed   Ice Chips      Thin Liquid Thin Liquid: Impaired Presentation: Cup;Straw;Self Fed Pharyngeal  Phase Impairments: Suspected delayed Swallow;Multiple swallows;Wet Vocal Quality;Throat Clearing - Immediate;Cough - Immediate    Nectar Thick Nectar Thick Liquid: Impaired Presentation: Cup;Self Fed Pharyngeal Phase Impairments: Suspected delayed Swallow   Honey Thick Honey Thick Liquid: Not tested   Puree Puree: Impaired Presentation: Self Fed;Spoon Pharyngeal Phase Impairments: Suspected delayed Swallow   Solid   GO    Solid: Impaired Presentation: Self Fed Oral Phase Impairments: Impaired mastication Oral Phase Functional Implications:  (prolongued mastication) Pharyngeal Phase Impairments: Suspected delayed Swallow      Christian DittyBonnie Rmoni Keplinger, MA CCC-SLP [phone]  Christian [name], Riley NearingBonnie Caroline 03/11/2013,2:27 PM

## 2013-03-12 DIAGNOSIS — J189 Pneumonia, unspecified organism: Secondary | ICD-10-CM

## 2013-03-12 LAB — CBC
HCT: 28.1 % — ABNORMAL LOW (ref 39.0–52.0)
HEMOGLOBIN: 9.3 g/dL — AB (ref 13.0–17.0)
MCH: 29.7 pg (ref 26.0–34.0)
MCHC: 33.1 g/dL (ref 30.0–36.0)
MCV: 89.8 fL (ref 78.0–100.0)
Platelets: 130 10*3/uL — ABNORMAL LOW (ref 150–400)
RBC: 3.13 MIL/uL — ABNORMAL LOW (ref 4.22–5.81)
RDW: 14.4 % (ref 11.5–15.5)
WBC: 8.1 10*3/uL (ref 4.0–10.5)

## 2013-03-12 LAB — CULTURE, BLOOD (ROUTINE X 2)

## 2013-03-12 LAB — GLUCOSE, CAPILLARY
GLUCOSE-CAPILLARY: 113 mg/dL — AB (ref 70–99)
GLUCOSE-CAPILLARY: 233 mg/dL — AB (ref 70–99)
GLUCOSE-CAPILLARY: 279 mg/dL — AB (ref 70–99)
Glucose-Capillary: 144 mg/dL — ABNORMAL HIGH (ref 70–99)
Glucose-Capillary: 188 mg/dL — ABNORMAL HIGH (ref 70–99)
Glucose-Capillary: 125 mg/dL — ABNORMAL HIGH (ref 70–99)

## 2013-03-12 LAB — RESPIRATORY VIRUS PANEL
ADENOVIRUS: NOT DETECTED
INFLUENZA A H1: NOT DETECTED
INFLUENZA A H3: NOT DETECTED
INFLUENZA B 1: NOT DETECTED
Influenza A: NOT DETECTED
Metapneumovirus: NOT DETECTED
PARAINFLUENZA 3 A: NOT DETECTED
Parainfluenza 1: NOT DETECTED
Parainfluenza 2: NOT DETECTED
RESPIRATORY SYNCYTIAL VIRUS A: NOT DETECTED
Respiratory Syncytial Virus B: NOT DETECTED
Rhinovirus: NOT DETECTED

## 2013-03-12 LAB — BASIC METABOLIC PANEL
BUN: 20 mg/dL (ref 6–23)
CO2: 21 mEq/L (ref 19–32)
Calcium: 8.3 mg/dL — ABNORMAL LOW (ref 8.4–10.5)
Chloride: 108 mEq/L (ref 96–112)
Creatinine, Ser: 1.05 mg/dL (ref 0.50–1.35)
GFR calc Af Amer: 70 mL/min — ABNORMAL LOW (ref >90)
GFR, EST NON AFRICAN AMERICAN: 61 mL/min — AB (ref >90)
GLUCOSE: 106 mg/dL — AB (ref 70–99)
POTASSIUM: 3.6 meq/L — AB (ref 3.7–5.3)
SODIUM: 141 meq/L (ref 137–147)

## 2013-03-12 MED ORDER — GUAIFENESIN 100 MG/5ML PO SOLN
5.0000 mL | ORAL | Status: DC | PRN
  Filled 2013-03-12: qty 5

## 2013-03-12 MED ORDER — DEXTROSE 5 % IV SOLN
2.0000 g | INTRAVENOUS | Status: DC
  Administered 2013-03-12: 2 g via INTRAVENOUS
  Filled 2013-03-12 (×2): qty 2

## 2013-03-12 NOTE — Progress Notes (Signed)
[facility] PROGRESS NOTE  Christian [name]:[id number] DOB: 23-Dec-1921 DOA: 03/09/2013 PCP: Laurey Morale, MD  Assessment/Plan: Sepsis -Present at the time of admission -Secondary to pneumonia and bacteremia -Improved -Leukocytosis improved, afebrile, hemodynamically stable Bacteremia -Streptococcus pneumoniae -Surveillance blood cultures today -Echocardiogram -If unrevealing, may need TEE -History of bioprosthetic aortic valve -Discontinue vancomycin and aztreonam- -Start ceftriaxone--2 g daily CAP -Positive urine Streptococcus pneumoniae antigen as well as bacteremia -Ceftriaxone as discussed -discontinue oseltamivir--respiratory virus panel negative Acute toxic encephalopathy -Secondary to sepsis -Gradually improving Acute kidney injury -Continue IV fluids -Gradually improving -Baseline creatinine 0.8-1.1 Diabetes mellitus type 2, controlled -12/26/2012 hemoglobin A1c 7.2 -Continue NovoLog sliding scale for now Dysphagia -Continue dysphagia 3 diet with nectar thickened liquids per speech therapy recommendations  Dementia  -Continue Aricept and Namenda  Family Communication:   Updated son on phone Disposition Plan:   Home when medically stable    Antibiotics:  Vancomycin and aztreonam 03/10/2013>>>> 03/12/2013  ceftriaxone 03/12/2013>>>    Procedures/Studies: Dg Chest Port 1 View  03/09/2013   CLINICAL DATA:  Cough and congestion. Chills. Altered mental status.  EXAM: PORTABLE CHEST - 1 VIEW  COMPARISON:  Two-view chest x-ray 01/03/2011.  FINDINGS: Heart size is normal. Atherosclerotic calcifications are again noted. Fused perihilar bronchitic changes are again seen. Superimposed ill-defined airspace disease is present at the lung bases, right greater than left. The visualized soft tissues and bony thorax are unremarkable.  IMPRESSION: 1. Acute on chronic perihilar bronchitic changes bilaterally. 2. Superimposed she right lower lobe airspace disease is  concerning for early pneumonia. 3. Atherosclerosis.   Electronically Signed   By: Lawrence Santiago M.D.   On: 03/09/2013 18:14         Subjective:  Patient is pleasantly confused. Denies any fevers, headaches, chest discomfort. He has intermittent shortness of breath. Denies any vomiting, abdominal pain, diarrhea. No respiratory distress reported by nursing staff.  Objective: Filed Vitals:   03/11/13 2100 03/11/13 2116 03/12/13 0412 03/12/13 0937  BP: 128/49  152/52 113/57  Pulse: 103  90 98  Temp: 98.1 F (36.7 C)  99.5 F (37.5 C) 99.4 F (37.4 C)  TempSrc: Oral  Oral Oral  Resp: 16  18 20   Height:      Weight:      SpO2: 93% 91% 96% 98%    Intake/Output Summary (Last 24 hours) at 03/12/13 1240 Last data filed at 03/12/13 0900  Gross per 24 hour  Intake 875.67 ml  Output    450 ml  Net 425.67 ml   Weight change:  Exam:   General:  Pt is alert, follows commands appropriately, not in acute distress  HEENT: No icterus, No thrush, [place]/AT  Cardiovascular: RRR, S1/S2, no rubs, no gallops  Respiratory: bilateral scattered rhonchi, right greater than left. No wheezing. Good air movement.  Abdomen: Soft/+BS, non tender, non distended, no guarding  Extremities: 1+LE edema, No lymphangitis, No petechiae, No rashes, no synovitis  Data Reviewed: Basic Metabolic Panel:  Recent Labs Lab 03/09/13 1738 03/10/13 0020 03/10/13 0258 03/12/13 0551  NA 141 142 144 141  K 6.7* 4.9 4.9 3.6*  CL 103 107 111 108  CO2 22 21 20 21   GLUCOSE 220* 213* 205* 106*  BUN 49* 44* 43* 20  CREATININE 1.41* 1.28 1.31 1.05  CALCIUM 9.9 8.9 8.7 8.3*   Liver Function Tests:  Recent Labs Lab 03/09/13 1738  AST 17  ALT 11  ALKPHOS 117  BILITOT 0.3  PROT 7.6  ALBUMIN 3.9   No  results found for this basename: LIPASE, AMYLASE,  in the last 168 hours No results found for this basename: AMMONIA,  in the last 168 hours CBC:  Recent Labs Lab 03/09/13 1738 03/10/13 0258  03/12/13 0551  WBC 11.1* 13.8* 8.1  NEUTROABS 10.0*  --   --   HGB 13.0 9.5* 9.3*  HCT 39.4 29.0* 28.1*  MCV 92.1 90.3 89.8  PLT 175 137* 130*   Cardiac Enzymes: No results found for this basename: CKTOTAL, CKMB, CKMBINDEX, TROPONINI,  in the last 168 hours BNP: No components found with this basename: POCBNP,  CBG:  Recent Labs Lab 03/11/13 1926 03/12/13 0046 03/12/13 0304 03/12/13 0757 03/12/13 1130  GLUCAP 174* 233* 113* 144* 188*    Recent Results (from the past 240 hour(s))  CULTURE, BLOOD (ROUTINE X 2)     Status: None   Collection Time    03/09/13  5:42 PM      Result Value Ref Range Status   Specimen Description BLOOD LEFT ANTECUBITAL   Final   Special Requests BOTTLES DRAWN AEROBIC AND ANAEROBIC 5CC   Final   Culture  Setup Time     Final   Value: 03/10/2013 00:14     Performed at [facility]   Culture     Final   Value:        BLOOD CULTURE RECEIVED NO GROWTH TO DATE CULTURE WILL BE HELD FOR 5 DAYS BEFORE ISSUING A FINAL NEGATIVE REPORT     Performed at [facility]   Report Status PENDING   Incomplete  CULTURE, BLOOD (ROUTINE X 2)     Status: None   Collection Time    03/09/13  5:45 PM      Result Value Ref Range Status   Specimen Description BLOOD WRIST RIGHT   Final   Special Requests BOTTLES DRAWN AEROBIC AND ANAEROBIC 5CC   Final   Culture  Setup Time     Final   Value: 03/10/2013 00:13     Performed at [facility]   Culture     Final   Value: STREPTOCOCCUS PNEUMONIAE     Note: Gram Stain Report Called to,Read Back By and Verified With: [name]@1 :17PM ON 03/10/13 BY DANTS     Performed at [facility]   Report Status 03/12/2013 FINAL   Final   Organism ID, Bacteria STREPTOCOCCUS PNEUMONIAE   Final  URINE CULTURE     Status: None   Collection Time    03/09/13  6:44 PM      Result Value Ref Range Status   Specimen Description URINE, CATHETERIZED   Final   Special Requests NONE   Final   Culture  Setup Time      Final   Value: 03/10/2013 01:12     Performed at [facility] Count     Final   Value: NO GROWTH     Performed at [facility]   Culture     Final   Value: NO GROWTH     Performed at [facility]   Report Status 03/10/2013 FINAL   Final  MRSA PCR SCREENING     Status: None   Collection Time    03/09/13  9:53 PM      Result Value Ref Range Status   MRSA by PCR NEGATIVE  NEGATIVE Final   Comment:            The GeneXpert MRSA Assay (FDA  approved for NASAL specimens     only), is one component of a     comprehensive MRSA colonization     surveillance program. It is not     intended to diagnose MRSA     infection nor to guide or     monitor treatment for     MRSA infections.  RESPIRATORY VIRUS PANEL     Status: None   Collection Time    03/10/13 11:18 AM      Result Value Ref Range Status   Source - RVPAN NASAL SWAB   Corrected   Comment: CORRECTED ON 03/08 AT 0143: PREVIOUSLY REPORTED AS NASAL SWAB   Respiratory Syncytial Virus A NOT DETECTED   Final   Respiratory Syncytial Virus B NOT DETECTED   Final   Influenza A NOT DETECTED   Final   Influenza B NOT DETECTED   Final   Parainfluenza 1 NOT DETECTED   Final   Parainfluenza 2 NOT DETECTED   Final   Parainfluenza 3 NOT DETECTED   Final   Metapneumovirus NOT DETECTED   Final   Rhinovirus NOT DETECTED   Final   Adenovirus NOT DETECTED   Final   Influenza A H1 NOT DETECTED   Final   Influenza A H3 NOT DETECTED   Final   Comment: (NOTE)           Normal Reference Range for each Analyte: NOT DETECTED     Testing performed using the Luminex xTAG Respiratory Viral Panel test     kit.     This test was developed and its performance characteristics determined     by [facility]. It has not been cleared or approved by the [place]     Food and Drug Administration. This test is used for clinical purposes.     It should not be regarded as investigational or for research. This     laboratory  is certified under the [place] (CLIA) as qualified to perform high complexity     clinical laboratory testing.     Performed at [facility]     Scheduled Meds: . aspirin  325 mg Oral Daily  . budesonide (PULMICORT) nebulizer solution  0.25 mg Nebulization BID  . cefTRIAXone (ROCEPHIN)  IV  2 g Intravenous Q24H  . donepezil  10 mg Oral QHS  . feeding supplement (RESOURCE BREEZE)  1 Container Oral TID BM  . guaiFENesin  600 mg Oral BID  . heparin  5,000 Units Subcutaneous 3 times per day  . insulin aspart  0-9 Units Subcutaneous 6 times per day  . memantine  10 mg Oral BID  . multivitamin-lutein  1 capsule Oral Daily  . oseltamivir  30 mg Oral BID  . simvastatin  40 mg Oral QHS   Continuous Infusions: . sodium chloride 20 mL/hr at 03/11/13 2153     Jemina Scahill, [place], DO  [facility] Pager [phone]  If 7PM-7AM, please contact night-coverage [email or web address] Password [email or web address] 03/12/2013, 12:40 PM   LOS: 3 days

## 2013-03-13 ENCOUNTER — Inpatient Hospital Stay (HOSPITAL_COMMUNITY): Payer: Medicare Other

## 2013-03-13 DIAGNOSIS — R7881 Bacteremia: Secondary | ICD-10-CM

## 2013-03-13 DIAGNOSIS — B953 Streptococcus pneumoniae as the cause of diseases classified elsewhere: Secondary | ICD-10-CM

## 2013-03-13 LAB — BASIC METABOLIC PANEL
BUN: 17 mg/dL (ref 6–23)
CALCIUM: 8.2 mg/dL — AB (ref 8.4–10.5)
CO2: 22 mEq/L (ref 19–32)
CREATININE: 0.95 mg/dL (ref 0.50–1.35)
Chloride: 109 mEq/L (ref 96–112)
GFR calc Af Amer: 83 mL/min — ABNORMAL LOW (ref >90)
GFR calc non Af Amer: 72 mL/min — ABNORMAL LOW (ref >90)
Glucose, Bld: 157 mg/dL — ABNORMAL HIGH (ref 70–99)
Potassium: 3.7 mEq/L (ref 3.7–5.3)
SODIUM: 143 meq/L (ref 137–147)

## 2013-03-13 LAB — GLUCOSE, CAPILLARY
GLUCOSE-CAPILLARY: 145 mg/dL — AB (ref 70–99)
GLUCOSE-CAPILLARY: 169 mg/dL — AB (ref 70–99)
GLUCOSE-CAPILLARY: 207 mg/dL — AB (ref 70–99)
Glucose-Capillary: 143 mg/dL — ABNORMAL HIGH (ref 70–99)
Glucose-Capillary: 170 mg/dL — ABNORMAL HIGH (ref 70–99)
Glucose-Capillary: 354 mg/dL — ABNORMAL HIGH (ref 70–99)

## 2013-03-13 LAB — CBC
HCT: 27.2 % — ABNORMAL LOW (ref 39.0–52.0)
Hemoglobin: 9.1 g/dL — ABNORMAL LOW (ref 13.0–17.0)
MCH: 29.4 pg (ref 26.0–34.0)
MCHC: 33.5 g/dL (ref 30.0–36.0)
MCV: 88 fL (ref 78.0–100.0)
PLATELETS: 139 10*3/uL — AB (ref 150–400)
RBC: 3.09 MIL/uL — ABNORMAL LOW (ref 4.22–5.81)
RDW: 14.1 % (ref 11.5–15.5)
WBC: 6.5 10*3/uL (ref 4.0–10.5)

## 2013-03-13 MED ORDER — DEXTROSE 5 % IV SOLN
2.0000 g | INTRAVENOUS | Status: DC
  Administered 2013-03-13 – 2013-03-16 (×4): 2 g via INTRAVENOUS
  Filled 2013-03-13 (×4): qty 2

## 2013-03-13 NOTE — Procedures (Signed)
Objective Swallowing Evaluation: Modified Barium Swallowing Study  Patient Details  Name: Christian [name] MRN: [id number] Date of Birth: 1921-10-18  Today's Date: 03/13/2013 Time: 1015-1045 SLP Time Calculation (min): 30 min  Past Medical History:  Past Medical History  Diagnosis Date  . Flashers or floaters, right eye   . Hyperlipidemia   . DJD of shoulder   . Aortic stenosis     severe sees Dr. Peter [place]  . Mitral insufficiency   . RBBB (right bundle branch block)   . LAFB (left anterior fascicular block)   . Shingles   . Diabetes mellitus   . Carotid artery disease     last dopplers on 05/23/09 were stable, LICA with 0-39% stenosis and RICA with 40-59%  . Acute arterial ischemic stroke, vertebrobasilar, thalamic 01/08  . Pneumonia 04/11  . Dementia    Past Surgical History:  Past Surgical History  Procedure Laterality Date  . Tonsillectomy    . Colonoscopy  11/04    clear no repeats needed  . Aortic valve replacement      used bovine valve in 2000 per Dr. Laneta Simmers  . Carotid endarterectomy  01/13/08    left per Dr. Hart [place]   HPI:  Christian [name] is a 80 y.o. male who presented to the ED with AMS. Family then noted after he woke up he was "more confused" than normal. By the time he got to the ED he was noted to be hypoxic, febrile, tachycardic, and had developed cough. CXR shows superimposed right LL airspace disease.     Assessment / Plan / Recommendation Clinical Impression  Dysphagia Diagnosis: Mild oral phase dysphagia;Mild pharyngeal phase dysphagia Clinical impression: Patient presents with a mild sensory-motor based oropharyngeal dysphagia characterized by a delayed swallow initiation resulting in penetration/aspiration of thin liquids. SLP attempted use of straw to facilitate chin tuck posture which did not improve airway protection. Recommend continuation of current diet with aspiration precautions to decrease risk. Suspect some degree of chronic dysphagia as cause  of acute PNA however may be exacerbated by respiratory decline. Will f/u at bedside for diet tolerance and to encourage use of strategies. Education complete with patient and family. Pending improvements make while in the hospital, patient may benefit from [facility] SLP f/u  to monitor for further improvements and determine if repeat MBS may be beneficial to evaluate for potential to advance diet.     Treatment Recommendation  Therapy as outlined in treatment plan below    Diet Recommendation Dysphagia 3 (Mechanical Soft);Nectar-thick liquid   Liquid Administration via: Cup;No straw Medication Administration: Whole meds with puree Supervision: Patient able to self feed;Full supervision/cueing for compensatory strategies Compensations: Slow rate;Small sips/bites Postural Changes and/or Swallow Maneuvers: Seated upright 90 degrees    Other  Recommendations Recommended Consults: MBS (prior to advancing diet) Oral Care Recommendations: Oral care BID Other Recommendations: Order thickener from pharmacy;Prohibited food (jello, ice cream, thin soups);Remove water pitcher   Follow Up Recommendations  Home health SLP    Frequency and Duration min 2x/week  2 weeks        General HPI: Christian [name] is a 80 y.o. male who presented to the ED with AMS. Family then noted after he woke up he was "more confused" than normal. By the time he got to the ED he was noted to be hypoxic, febrile, tachycardic, and had developed cough. CXR shows superimposed right LL airspace disease. Type of Study: Modified Barium Swallowing Study Reason for Referral: Objectively evaluate swallowing function  Previous Swallow Assessment: none in chart Diet Prior to this Study: Dysphagia 3 (soft);Nectar-thick liquids Temperature Spikes Noted: No Respiratory Status: Room air History of Recent Intubation: No Behavior/Cognition: Alert;Cooperative;Pleasant mood;Confused;Requires cueing Oral Cavity - Dentition:  (top partial) Oral  Motor / Sensory Function: Impaired - see Bedside swallow eval Self-Feeding Abilities: Able to feed self Patient Positioning: Upright in chair Baseline Vocal Quality: Clear Volitional Cough: Congested Volitional Swallow: Able to elicit Anatomy: Other (Comment) (? small osteophytes along lower C-spine, MD not present) Pharyngeal Secretions: Not observed secondary MBS    Reason for Referral Objectively evaluate swallowing function   Oral Phase Oral Preparation/Oral Phase Oral Phase: Impaired Oral - Nectar Oral - Nectar Cup: Delayed oral transit Oral - Thin Oral - Thin Cup: Delayed oral transit Oral - Thin Straw: Delayed oral transit Oral - Solids Oral - Mechanical Soft: Delayed oral transit Oral - Pill: Delayed oral transit   Pharyngeal Phase Pharyngeal Phase Pharyngeal Phase: Impaired Pharyngeal - Nectar Pharyngeal - Nectar Cup: Delayed swallow initiation;Premature spillage to valleculae;Pharyngeal residue - valleculae;Reduced tongue base retraction Pharyngeal - Thin Pharyngeal - Thin Cup: Delayed swallow initiation;Premature spillage to pyriform sinuses;Reduced tongue base retraction;Reduced airway/laryngeal closure;Penetration/Aspiration before swallow Penetration/Aspiration details (thin cup): Material enters airway, passes BELOW cords and not ejected out despite cough attempt by patient Pharyngeal - Thin Straw: Delayed swallow initiation;Premature spillage to pyriform sinuses;Reduced tongue base retraction;Reduced airway/laryngeal closure;Penetration/Aspiration before swallow (attempted chin tuck, unsuccessful) Penetration/Aspiration details (thin straw): Material enters airway, passes BELOW cords and not ejected out despite cough attempt by patient Pharyngeal - Solids Pharyngeal - Puree: Delayed swallow initiation;Premature spillage to valleculae;Pharyngeal residue - valleculae;Reduced tongue base retraction Pharyngeal - Mechanical Soft: Delayed swallow initiation;Premature  spillage to valleculae;Reduced tongue base retraction;Pharyngeal residue - valleculae Pharyngeal - Pill: Within functional limits (provided whole in puree, patient masticated)  Cervical Esophageal Phase    GO   Ferdinand LangoLeah Jacari Kirsten MA, CCC-SLP [phone]  Cervical Esophageal Phase [facility]ervical Esophageal [facility]: [facility]         Saidah Kempton Meryl 03/13/2013, 1:01 PM

## 2013-03-13 NOTE — Evaluation (Signed)
Physical Therapy Evaluation Patient Details Name: Christian [name] MRN: [id number] DOB: 11-03-1921 Today's Date: 03/13/2013 Time: 1320-1350 PT Time Calculation (min): 30 min  PT Assessment / Plan / Recommendation History of Present Illness  Christian [name] is a 80 y.o. male who presents to the ED with AMS.  Symptoms onset this afternoon, was at baseline before he took a nap.  Family then notes after he woke up he was "more confused" than normal.  By the time he got to the ED he was noted to be hypoxic, febrile, tachycardic, and had developed cough  Clinical Impression  Patient presents with decreased independence with mobility due to deficits listed below (PT problem list).  He will benefit from skilled PT in the acute setting to allow return home following STSNF stay.  Wife normally assists patient, but discharging from hospital today herself.    PT Assessment  Patient needs continued PT services    Follow Up Recommendations  SNF;Supervision/Assistance - 24 hour    Does the patient have the potential to tolerate intense rehabilitation    N/A  Barriers to Discharge Decreased caregiver support wife discharging today from hospital    Equipment Recommendations  None recommended by PT    Recommendations for Other Services  None   Frequency Min 3X/week    Precautions / Restrictions Precautions Precautions: Fall   Pertinent Vitals/Pain No pain compliants      Mobility  Bed Mobility Overal bed mobility: Needs Assistance Bed Mobility: Supine to Sit Supine to sit: Mod assist General bed mobility comments: assist to lift trunk and scoot to edge of bed Transfers Overall transfer level: Needs assistance Equipment used: Rolling walker (2 wheeled) Transfers: Sit to/from [employer] Transfers Sit to Stand: From elevated surface;Mod assist Stand pivot transfers: Mod assist General transfer comment: from bed, assist for lifting and balance; with walker pivotal steps with cues and  assist to chair        PT Diagnosis: Abnormality of gait;Generalized weakness  PT Problem List: Decreased strength;Decreased balance;Decreased activity tolerance;Decreased mobility;Decreased knowledge of use of DME;Decreased safety awareness PT Treatment Interventions: DME instruction;Therapeutic activities;Patient/family education;Functional mobility training;Balance training;Gait training     PT Goals(Current goals can be found in the care plan section) Acute Rehab PT Goals Patient Stated Goal: To improve strength to go home after rehab PT Goal Formulation: With patient/family Time For Goal Achievement: 03/27/13 Potential to Achieve Goals: Good  Visit Information  Last PT Received On: 03/13/13 Assistance Needed: +1 History of Present Illness: Christian [name] is a 80 y.o. male who presents to the ED with AMS.  Symptoms onset this afternoon, was at baseline before he took a nap.  Family then notes after he woke up he was "more confused" than normal.  By the time he got to the ED he was noted to be hypoxic, febrile, tachycardic, and had developed cough       Prior Functioning  Home Living Family/patient expects to be discharged to:: Skilled nursing facility Living Arrangements: Spouse/significant other Prior Function Level of Independence: Independent with assistive device(s);Needs assistance Gait / Transfers Assistance Needed: walked with walker, struggled at times to stand ADL's / Homemaking Assistance Needed: wife helped shower weekly Comments: wife in process of discharging; admitted due to stroke per daughter (here from [place]) Communication Communication: HOH    Cognition  Cognition Arousal/Alertness: Awake/alert Behavior During Therapy: WFL for tasks assessed/performed Overall Cognitive Status: History of cognitive impairments - at baseline    Extremity/Trunk Assessment Lower Extremity Assessment Lower  Extremity Assessment: RLE deficits/detail;LLE deficits/detail RLE  Deficits / Details: AAROM WFL, strength hip flexion 2+/5, knee extension 4-/5, ankle DF 4/5 LLE Deficits / Details: AAROM WFL, strength hip flexion 2+/5, knee extension 4-/5, ankle DF 4/5 Cervical / Trunk Assessment Cervical / Trunk Assessment: Kyphotic   Balance Balance Overall balance assessment: Needs assistance Sitting-balance support: Feet supported Sitting balance-Leahy Scale: Fair Standing balance support: Bilateral upper extremity supported Standing balance-Leahy Scale: Poor Standing balance comment: UE support needed for balance  End of Session PT - End of Session Equipment Utilized During Treatment: Gait belt Activity Tolerance: Patient limited by fatigue Patient left: in chair;with call bell/phone within reach;with family/visitor present  GP     [facility] LLPWYNN,CYNDI 03/13/2013, 4:07 PM Sheran Lawlessyndi Dameisha Tschida, PT [phone] 03/13/2013

## 2013-03-13 NOTE — Progress Notes (Signed)
[facility] PROGRESS NOTE  Christian [name]:[id number] DOB: 12-08-1921 DOA: 03/09/2013 PCP: Laurey Morale, MD  Assessment/Plan: Sepsis  -Present at the time of admission  -Secondary to pneumonia and bacteremia  -Improved  -Leukocytosis improved, afebrile, hemodynamically stable  Bacteremia  -Streptococcus pneumoniae  -Surveillance blood cultures-neg at 24 hrs -TEE on 03/14/13 -History of bioprosthetic aortic valve  -Discontinue vancomycin and aztreonam-  -continue ceftriaxone--2 g daily  CAP  -Positive urine Streptococcus pneumoniae antigen as well as bacteremia  -Ceftriaxone as discussed  -discontinue oseltamivir--respiratory virus panel negative  Acute toxic encephalopathy  -Secondary to sepsis  -Gradually improving  Acute kidney injury  -Continue IV fluids  -Gradually improving  -Baseline creatinine 0.8-1.1  Diabetes mellitus type 2, controlled  -12/26/2012 hemoglobin A1c 7.2  -Discontinue CBGs, NovoLog sliding scale Dysphagia  -Continue dysphagia 3 diet with nectar thickened liquids per speech therapy recommendations  Dementia  -Continue Aricept and Namenda  Family Communication: Updated daughter at bedside--total time 35 minutes Disposition Plan: Home when medically stable  Antibiotics:  Vancomycin and aztreonam 03/10/2013>>>> 03/12/2013  ceftriaxone 03/12/2013>>>          Procedures/Studies: Dg Chest Port 1 View  03/09/2013   CLINICAL DATA:  Cough and congestion. Chills. Altered mental status.  EXAM: PORTABLE CHEST - 1 VIEW  COMPARISON:  Two-view chest x-ray 01/03/2011.  FINDINGS: Heart size is normal. Atherosclerotic calcifications are again noted. Fused perihilar bronchitic changes are again seen. Superimposed ill-defined airspace disease is present at the lung bases, right greater than left. The visualized soft tissues and bony thorax are unremarkable.  IMPRESSION: 1. Acute on chronic perihilar bronchitic changes bilaterally. 2. Superimposed she right  lower lobe airspace disease is concerning for early pneumonia. 3. Atherosclerosis.   Electronically Signed   By: Lawrence Santiago M.D.   On: 03/09/2013 18:14   Dg Swallowing Func-speech Pathology  03/13/2013   [name], CCC-SLP     03/13/2013  1:02 PM Objective Swallowing Evaluation: Modified Barium Swallowing Study   Patient Details  Name: Christian [name] MRN: [id number] Date of Birth: [birth year]  Today's Date: 03/13/2013 Time: 1015-1045 SLP Time Calculation (min): 30 min  Past Medical History:  Past Medical History  Diagnosis Date  . Flashers or floaters, right eye   . Hyperlipidemia   . DJD of shoulder   . Aortic stenosis     severe sees Dr. Peter Martinique  . Mitral insufficiency   . RBBB (right bundle branch block)   . LAFB (left anterior fascicular block)   . Shingles   . Diabetes mellitus   . Carotid artery disease     last dopplers on 3/66/44 were stable, LICA with 0-34% stenosis  and RICA with 40-59%  . Acute arterial ischemic stroke, vertebrobasilar, thalamic 01/08   . Pneumonia 04/11  . Dementia    Past Surgical History:  Past Surgical History  Procedure Laterality Date  . Tonsillectomy    . Colonoscopy  11/04    clear no repeats needed  . Aortic valve replacement      used bovine valve in 2000 per Dr. Cyndia Bent  . Carotid endarterectomy  01/13/08    left per Dr. Kellie Simmering   HPI:  Christian [name] is a 80 y.o. male who presented to the ED with  AMS. Family then noted after he woke up he was "more confused"  than normal. By the time he got to the ED he was noted to be  hypoxic, febrile, tachycardic, and had developed cough. CXR shows  superimposed  right LL airspace disease.     Assessment / Plan / Recommendation Clinical Impression  Dysphagia Diagnosis: Mild oral phase dysphagia;Mild pharyngeal  phase dysphagia Clinical impression: Patient presents with a mild sensory-motor  based oropharyngeal dysphagia characterized by a delayed swallow  initiation resulting in penetration/aspiration of thin liquids.  SLP attempted  use of straw to facilitate chin tuck posture which  did not improve airway protection. Recommend continuation of  current diet with aspiration precautions to decrease risk.  Suspect some degree of chronic dysphagia as cause of acute PNA  however may be exacerbated by respiratory decline. Will f/u at  bedside for diet tolerance and to encourage use of strategies.  Education complete with patient and family. Pending improvements  make while in the hospital, patient may benefit from [facility] SLP f/u   to monitor for further improvements and determine if repeat MBS  may be beneficial to evaluate for potential to advance diet.     Treatment Recommendation  Therapy as outlined in treatment plan below    Diet Recommendation Dysphagia 3 (Mechanical Soft);Nectar-thick  liquid   Liquid Administration via: Cup;No straw Medication Administration: Whole meds with puree Supervision: Patient able to self feed;Full supervision/cueing  for compensatory strategies Compensations: Slow rate;Small sips/bites Postural Changes and/or Swallow Maneuvers: Seated upright 90  degrees    Other  Recommendations Recommended Consults: MBS (prior to  advancing diet) Oral Care Recommendations: Oral care BID Other Recommendations: Order thickener from pharmacy;Prohibited  food (jello, ice cream, thin soups);Remove water pitcher   Follow Up Recommendations  Home health SLP    Frequency and Duration min 2x/week  2 weeks        General HPI: Christian [name] is a 80 y.o. male who presented to  the ED with AMS. Family then noted after he woke up he was "more  confused" than normal. By the time he got to the ED he was noted  to be hypoxic, febrile, tachycardic, and had developed cough. CXR  shows superimposed right LL airspace disease. Type of Study: Modified Barium Swallowing Study Reason for Referral: Objectively evaluate swallowing function Previous Swallow Assessment: none in chart Diet Prior to this Study: Dysphagia 3 (soft);Nectar-thick liquids Temperature  Spikes Noted: No Respiratory Status: Room air History of Recent Intubation: No Behavior/Cognition: Alert;Cooperative;Pleasant  mood;Confused;Requires cueing Oral Cavity - Dentition:  (top partial) Oral Motor / Sensory Function: Impaired - see Bedside swallow  eval Self-Feeding Abilities: Able to feed self Patient Positioning: Upright in chair Baseline Vocal Quality: Clear Volitional Cough: Congested Volitional Swallow: Able to elicit Anatomy: Other (Comment) (? small osteophytes along lower  C-spine, MD not present) Pharyngeal Secretions: Not observed secondary MBS    Reason for Referral Objectively evaluate swallowing function   Oral Phase Oral Preparation/Oral Phase Oral Phase: Impaired Oral - Nectar Oral - Nectar Cup: Delayed oral transit Oral - Thin Oral - Thin Cup: Delayed oral transit Oral - Thin Straw: Delayed oral transit Oral - Solids Oral - Mechanical Soft: Delayed oral transit Oral - Pill: Delayed oral transit   Pharyngeal Phase Pharyngeal Phase Pharyngeal Phase: Impaired Pharyngeal - Nectar Pharyngeal - Nectar Cup: Delayed swallow initiation;Premature  spillage to valleculae;Pharyngeal residue - valleculae;Reduced  tongue base retraction Pharyngeal - Thin Pharyngeal - Thin Cup: Delayed swallow initiation;Premature  spillage to pyriform sinuses;Reduced tongue base  retraction;Reduced airway/laryngeal  closure;Penetration/Aspiration before swallow Penetration/Aspiration details (thin cup): Material enters  airway, passes BELOW cords and not ejected out despite cough  attempt by patient Pharyngeal - Thin Straw: Delayed swallow  initiation;Premature  spillage to pyriform sinuses;Reduced tongue base  retraction;Reduced airway/laryngeal  closure;Penetration/Aspiration before swallow (attempted chin  tuck, unsuccessful) Penetration/Aspiration details (thin straw): Material enters  airway, passes BELOW cords and not ejected out despite cough  attempt by patient Pharyngeal - Solids Pharyngeal - Puree: Delayed  swallow initiation;Premature spillage  to valleculae;Pharyngeal residue - valleculae;Reduced tongue base  retraction Pharyngeal - Mechanical Soft: Delayed swallow  initiation;Premature spillage to valleculae;Reduced tongue base  retraction;Pharyngeal residue - valleculae Pharyngeal - Pill: Within functional limits (provided whole in  puree, patient masticated)  Cervical Esophageal Phase    GO   Gabriel Rainwater MA, CCC-SLP [phone]  Cervical Esophageal Phase Cervical Esophageal Phase: [facility]         McCoy Leah Meryl 03/13/2013, 1:01 PM          Subjective: Patient is much more alert today. Denies and fevers, chills, chest pain, shortness breath, nausea, vomiting, diarrhea, abdominal pain. No headaches or dizziness to  Objective: Filed Vitals:   03/12/13 2011 03/12/13 2102 03/13/13 0412 03/13/13 1101  BP: 130/58  147/69 135/52  Pulse: 103  111 92  Temp: 98.7 F (37.1 C)  98.3 F (36.8 C) 98.8 F (37.1 C)  TempSrc: Oral  Oral   Resp: 18  18 18   Height:      Weight:      SpO2: 92% 91% 91% 94%    Intake/Output Summary (Last 24 hours) at 03/13/13 1915 Last data filed at 03/13/13 1852  Gross per 24 hour  Intake    120 ml  Output   1700 ml  Net  -1580 ml   Weight change:  Exam:   General:  Pt is alert, follows commands appropriately, not in acute distress  HEENT: No icterus, No thrush, No neck mass, [place]/AT  Cardiovascular: RRR, S1/S2, no rubs, no gallops  Respiratory: Bilateral scattered rhonchi, right at the left. No wheezing. Good air movement  Abdomen: Soft/+BS, non tender, non distended, no guarding  Extremities: 1+LE edema, No lymphangitis, No petechiae, No rashes, no synovitis  Data Reviewed: Basic Metabolic Panel:  Recent Labs Lab 03/09/13 1738 03/10/13 0020 03/10/13 0258 03/12/13 0551 03/13/13 0652  NA 141 142 144 141 143  K 6.7* 4.9 4.9 3.6* 3.7  CL 103 107 111 108 109  CO2 22 21 20 21 22   GLUCOSE 220* 213* 205* 106* 157*  BUN 49* 44* 43* 20 17  CREATININE  1.41* 1.28 1.31 1.05 0.95  CALCIUM 9.9 8.9 8.7 8.3* 8.2*   Liver Function Tests:  Recent Labs Lab 03/09/13 1738  AST 17  ALT 11  ALKPHOS 117  BILITOT 0.3  PROT 7.6  ALBUMIN 3.9   No results found for this basename: LIPASE, AMYLASE,  in the last 168 hours No results found for this basename: AMMONIA,  in the last 168 hours CBC:  Recent Labs Lab 03/09/13 1738 03/10/13 0258 03/12/13 0551 03/13/13 0652  WBC 11.1* 13.8* 8.1 6.5  NEUTROABS 10.0*  --   --   --   HGB 13.0 9.5* 9.3* 9.1*  HCT 39.4 29.0* 28.1* 27.2*  MCV 92.1 90.3 89.8 88.0  PLT 175 137* 130* 139*   Cardiac Enzymes: No results found for this basename: CKTOTAL, CKMB, CKMBINDEX, TROPONINI,  in the last 168 hours BNP: No components found with this basename: POCBNP,  CBG:  Recent Labs Lab 03/12/13 2358 03/13/13 0413 03/13/13 0738 03/13/13 1056 03/13/13 1600  GLUCAP 169* 145* 143* 207* 170*    Recent Results (from the past 240 hour(s))  CULTURE, BLOOD (ROUTINE X 2)     Status: None   Collection Time    03/09/13  5:42 PM      Result Value Ref Range Status   Specimen Description BLOOD LEFT ANTECUBITAL   Final   Special Requests BOTTLES DRAWN AEROBIC AND ANAEROBIC 5CC   Final   Culture  Setup Time     Final   Value: 03/10/2013 00:14     Performed at [facility]   Culture     Final   Value:        BLOOD CULTURE RECEIVED NO GROWTH TO DATE CULTURE WILL BE HELD FOR 5 DAYS BEFORE ISSUING A FINAL NEGATIVE REPORT     Performed at [facility]   Report Status PENDING   Incomplete  CULTURE, BLOOD (ROUTINE X 2)     Status: None   Collection Time    03/09/13  5:45 PM      Result Value Ref Range Status   Specimen Description BLOOD WRIST RIGHT   Final   Special Requests BOTTLES DRAWN AEROBIC AND ANAEROBIC 5CC   Final   Culture  Setup Time     Final   Value: 03/10/2013 00:13     Performed at [facility]   Culture     Final   Value: STREPTOCOCCUS PNEUMONIAE     Note: Gram Stain Report  Called to,Read Back By and Verified With: [name]@1 :17PM ON 03/10/13 BY DANTS     Performed at [facility]   Report Status 03/12/2013 FINAL   Final   Organism ID, Bacteria STREPTOCOCCUS PNEUMONIAE   Final  URINE CULTURE     Status: None   Collection Time    03/09/13  6:44 PM      Result Value Ref Range Status   Specimen Description URINE, CATHETERIZED   Final   Special Requests NONE   Final   Culture  Setup Time     Final   Value: 03/10/2013 01:12     Performed at [place]     Final   Value: NO GROWTH     Performed at [facility]   Culture     Final   Value: NO GROWTH     Performed at [facility]   Report Status 03/10/2013 FINAL   Final  MRSA PCR SCREENING     Status: None   Collection Time    03/09/13  9:53 PM      Result Value Ref Range Status   MRSA by PCR NEGATIVE  NEGATIVE Final   Comment:            The GeneXpert MRSA Assay (FDA     approved for NASAL specimens     only), is one component of a     comprehensive MRSA colonization     surveillance program. It is not     intended to diagnose MRSA     infection nor to guide or     monitor treatment for     MRSA infections.  RESPIRATORY VIRUS PANEL     Status: None   Collection Time    03/10/13 11:18 AM      Result Value Ref Range Status   Source - RVPAN NASAL SWAB   Corrected   Comment: CORRECTED ON 03/08 AT 0143: PREVIOUSLY REPORTED AS NASAL SWAB   Respiratory Syncytial Virus A NOT DETECTED   Final   Respiratory Syncytial Virus B NOT DETECTED  Final   Influenza A NOT DETECTED   Final   Influenza B NOT DETECTED   Final   Parainfluenza 1 NOT DETECTED   Final   Parainfluenza 2 NOT DETECTED   Final   Parainfluenza 3 NOT DETECTED   Final   Metapneumovirus NOT DETECTED   Final   Rhinovirus NOT DETECTED   Final   Adenovirus NOT DETECTED   Final   Influenza A H1 NOT DETECTED   Final   Influenza A H3 NOT DETECTED   Final   Comment: (NOTE)           Normal  Reference Range for each Analyte: NOT DETECTED     Testing performed using the Luminex xTAG Respiratory Viral Panel test     kit.     This test was developed and its performance characteristics determined     by [facility]. It has not been cleared or approved by the [place]     Food and Drug Administration. This test is used for clinical purposes.     It should not be regarded as investigational or for research. This     laboratory is certified under the [place] (CLIA) as qualified to perform high complexity     clinical laboratory testing.     Performed at [place], BLOOD (ROUTINE X 2)     Status: None   Collection Time    03/12/13  9:18 AM      Result Value Ref Range Status   Specimen Description BLOOD LEFT ARM   Final   Special Requests BOTTLES DRAWN AEROBIC ONLY 3CC   Final   Culture  Setup Time     Final   Value: 03/12/2013 18:59     Performed at [facility]   Culture     Final   Value:        BLOOD CULTURE RECEIVED NO GROWTH TO DATE CULTURE WILL BE HELD FOR 5 DAYS BEFORE ISSUING A FINAL NEGATIVE REPORT     Performed at [facility]   Report Status PENDING   Incomplete  CULTURE, BLOOD (ROUTINE X 2)     Status: None   Collection Time    03/12/13  9:23 AM      Result Value Ref Range Status   Specimen Description BLOOD LEFT HAND   Final   Special Requests BOTTLES DRAWN AEROBIC ONLY 3CC   Final   Culture  Setup Time     Final   Value: 03/12/2013 18:58     Performed at [facility]   Culture     Final   Value:        BLOOD CULTURE RECEIVED NO GROWTH TO DATE CULTURE WILL BE HELD FOR 5 DAYS BEFORE ISSUING A FINAL NEGATIVE REPORT     Performed at [facility]   Report Status PENDING   Incomplete     Scheduled Meds: . aspirin  325 mg Oral Daily  . budesonide (PULMICORT) nebulizer solution  0.25 mg Nebulization BID  . cefTRIAXone (ROCEPHIN)  IV  2 g Intravenous Q24H  .  donepezil  10 mg Oral QHS  . feeding supplement (RESOURCE BREEZE)  1 Container Oral TID BM  . heparin  5,000 Units Subcutaneous 3 times per day  . insulin aspart  0-9 Units Subcutaneous 6 times per day  . memantine  10 mg Oral BID  . multivitamin-lutein  1 capsule Oral Daily  .  simvastatin  40 mg Oral QHS   Continuous Infusions: . sodium chloride 20 mL/hr at 03/11/13 2153     Nils Thor, Deandra, DO  [facility] Pager [phone]  If 7PM-7AM, please contact night-coverage [email or web address] Password [email or web address] 03/13/2013, 7:15 PM   LOS: 4 days

## 2013-03-13 NOTE — Progress Notes (Signed)
Results for Christian [name], Christian [name] (MRN [id number]) as of 03/13/2013 20:54  Ref. Range 03/13/2013 20:22  Glucose-Capillary Latest Range: 70-99 mg/dL 409354 (H)  NP made aware. No new order.

## 2013-03-14 ENCOUNTER — Encounter (HOSPITAL_COMMUNITY): Payer: Self-pay | Admitting: Cardiology

## 2013-03-14 ENCOUNTER — Encounter (HOSPITAL_COMMUNITY)
Admission: EM | Disposition: A | Discharge status: Skilled Nursing Facility | Payer: Self-pay | Source: Home / Self Care | Attending: Internal Medicine

## 2013-03-14 DIAGNOSIS — I059 Rheumatic mitral valve disease, unspecified: Secondary | ICD-10-CM

## 2013-03-14 HISTORY — PX: TEE WITHOUT CARDIOVERSION: SHX5443

## 2013-03-14 LAB — GLUCOSE, CAPILLARY
Glucose-Capillary: 179 mg/dL — ABNORMAL HIGH (ref 70–99)
Glucose-Capillary: 152 mg/dL — ABNORMAL HIGH (ref 70–99)
Glucose-Capillary: 151 mg/dL — ABNORMAL HIGH (ref 70–99)

## 2013-03-14 LAB — BASIC METABOLIC PANEL
BUN: 15 mg/dL (ref 6–23)
CHLORIDE: 107 meq/L (ref 96–112)
CO2: 25 meq/L (ref 19–32)
CREATININE: 0.96 mg/dL (ref 0.50–1.35)
Calcium: 8.3 mg/dL — ABNORMAL LOW (ref 8.4–10.5)
GFR calc Af Amer: 83 mL/min — ABNORMAL LOW (ref >90)
GFR calc non Af Amer: 71 mL/min — ABNORMAL LOW (ref >90)
Glucose, Bld: 180 mg/dL — ABNORMAL HIGH (ref 70–99)
Potassium: 3.8 mEq/L (ref 3.7–5.3)
Sodium: 142 mEq/L (ref 137–147)

## 2013-03-14 LAB — CBC
HCT: 27.8 % — ABNORMAL LOW (ref 39.0–52.0)
Hemoglobin: 9.4 g/dL — ABNORMAL LOW (ref 13.0–17.0)
MCH: 29.7 pg (ref 26.0–34.0)
MCHC: 33.8 g/dL (ref 30.0–36.0)
MCV: 88 fL (ref 78.0–100.0)
Platelets: 154 10*3/uL (ref 150–400)
RBC: 3.16 MIL/uL — ABNORMAL LOW (ref 4.22–5.81)
RDW: 14.1 % (ref 11.5–15.5)
WBC: 6.4 10*3/uL (ref 4.0–10.5)

## 2013-03-14 SURGERY — ECHOCARDIOGRAM, TRANSESOPHAGEAL
Anesthesia: Moderate Sedation

## 2013-03-14 MED ORDER — STARCH (THICKENING) PO POWD: ORAL | Status: AC

## 2013-03-14 MED ORDER — FENTANYL CITRATE 0.05 MG/ML IJ SOLN
INTRAMUSCULAR | Status: AC
  Filled 2013-03-14: qty 2

## 2013-03-14 MED ORDER — MIDAZOLAM HCL 10 MG/2ML IJ SOLN
INTRAMUSCULAR | Status: DC | PRN
  Administered 2013-03-14 (×3): 1 mg via INTRAVENOUS

## 2013-03-14 MED ORDER — BOOST / RESOURCE BREEZE PO LIQD: 1.0000 | Freq: Three times a day (TID) | ORAL | Status: DC

## 2013-03-14 MED ORDER — MIDAZOLAM HCL 5 MG/ML IJ SOLN
INTRAMUSCULAR | Status: AC
  Filled 2013-03-14: qty 1

## 2013-03-14 MED ORDER — SODIUM CHLORIDE 0.9 % IJ SOLN
10.0000 mL | INTRAMUSCULAR | Status: DC | PRN
  Administered 2013-03-16: 10 mL

## 2013-03-14 MED ORDER — SODIUM CHLORIDE 0.9 % IV SOLN: INTRAVENOUS | Status: DC

## 2013-03-14 MED ORDER — CEFTRIAXONE SODIUM 2 G IJ SOLR: 2.0000 g | INTRAMUSCULAR | Status: DC

## 2013-03-14 MED ORDER — SITAGLIPTIN PHOSPHATE 50 MG PO TABS: 50.0000 mg | ORAL_TABLET | Freq: Every day | ORAL | Status: DC

## 2013-03-14 NOTE — Progress Notes (Signed)
SLP Cancellation Note  Patient Details Name: Christian [name] MRN: [id number] DOB: March 17, 1921   Cancelled treatment:       Reason Eval/Treat Not Completed: Patient at procedure or test/unavailable. Pt is NPO for TEE today.   Winslow Ederer, Riley NearingBonnie Caroline 03/14/2013, 1:28 PM

## 2013-03-14 NOTE — Discharge Summary (Signed)
Physician Discharge Summary  Christian [name] PPJ:[id number] DOB: 03-03-1921 DOA: 03/09/2013  PCP: Laurey Morale, MD  Admit date: 03/09/2013 Discharge date: 03/14/2013  Recommendations for Outpatient Follow-up:  1. Pt will need to follow up with PCP in 2 weeks post discharge 2. Please obtain BMP to evaluate electrolytes and kidney function 3. Please also check CBC to evaluate Hg and Hct levels 4. Continue ceftriaxone 2 g IV daily through 03/26/2013. 5. Remove PICC line after last dose of ceftriaxone.   Discharge Diagnoses:  Sepsis  -Present at the time of admission  -Secondary to pneumonia and bacteremia  -Improved  -Leukocytosis improved, afebrile, hemodynamically stable  Bacteremia  -Streptococcus pneumoniae  -Surveillance blood cultures-neg -TEE on 03/14/13--negative for any vegetations  -History of bioprosthetic aortic valve  -Discontinue vancomycin and aztreonam-  -continue ceftriaxone--2 g daily  -PICC line will be placed since surveillance blood cultures are negative -The patient will continue on ceftriaxone, 2 g IV daily through 03/26/2013 which is 14 days beyond the first negative culture CAP  -Positive urine Streptococcus pneumoniae antigen as well as bacteremia  -Ceftriaxone as discussed--patient tolerating without difficulty  -discontinue oseltamivir--respiratory virus panel negative  Acute toxic encephalopathy  -Secondary to sepsis  -Gradually improving with antibiotics  Acute kidney injury  -Continue IV fluids  -Gradually improving  -Baseline creatinine 0.8-1.1  Diabetes mellitus type 2, controlled  -12/26/2012 hemoglobin A1c 7.2  -Discontinue CBGs, NovoLog sliding scale  -The patient will be discharged back on Januvia, lower dose -will also restart glyburide -He will not be restarted on metformin as his creatinine clearance is approximately 40 Dysphagia  -Continue dysphagia 3 diet with nectar thickened liquids per speech therapy recommendations  Dementia   -Continue Aricept and Namenda  Family Communication: Updated daughter and wife at bedside  Disposition Plan:SNF Antibiotics:  Vancomycin and aztreonam 03/10/2013>>>> 03/12/2013  ceftriaxone 03/12/2013>>>03/28/13   Discharge Condition: Stable  Disposition:  skilled nursing facility  Diet: Dysphagia 3 diet with nectar thickened liquids Wt Readings from Last 3 Encounters:  03/09/13 60.7 kg (133 lb 13.1 oz)  03/09/13 60.7 kg (133 lb 13.1 oz)  12/26/12 56.7 kg (125 lb)    History of present illness:  80 year old male presents from home with altered mental status and increasing lethargy. There is also a history of worsening generalized weakness. The patient was unable to provide any history due to his encephalopathy. The patient was noted to have a fever of 101.38F with hypoxemia at the time of admission with oxygen saturation 85% on room air. Chest x-ray revealed right lower lobe infiltrate. The patient was started on vancomycin and aztreonam empirically. Blood cultures were sent and grew Streptococcus pneumoniae. His antibiotics were tailored to ceftriaxone. He tolerated it well. The patient's mentation gradually improved. After discussion with the patient's wife, he remained full code. The patient was noted to have acute on chronic renal failure. Initial creatinine was 1.41. This did improve back to his usual baseline after fluid resuscitation. The patient was evaluated by speech therapy due to concerns for aspiration. They recommended a dysphagia 3 diet with nectar thickened liquids. Surveillance blood cultures were obtained and remained negative. Given the history of a prosthetic heart valve, transesophageal echocardiogram was performed. It was negative for any vegetations. After surveillance blood cultures remained negative, a PICC line was placed. The patient will continue on ceftriaxone for 14 days beyond the first negative culture. The anticipated stop date of antibiotics is 03/26/2013. The  patient was started on NovoLog sliding scale initially. His CBGs were controlled. Hemoglobin  A1c was noted to be 7.2 on 12/26/2012.    Discharge Exam: Filed Vitals:   03/14/13 1629  BP: 163/79  Pulse: 75  Temp: 98.4 F (36.9 C)  Resp: 20   Filed Vitals:   03/14/13 1538 03/14/13 1539 03/14/13 1548 03/14/13 1629  BP: 143/51   163/79  Pulse: 85   75  Temp: 98 F (36.7 C)   98.4 F (36.9 C)  TempSrc:      Resp: 17   20  Height:      Weight:      SpO2: 90% 93% 96% 94%   General: A&O x 2, NAD, pleasant, cooperative Cardiovascular: RRR, no rub, no gallop, no S3 Respiratory: Bilateral scattered rhonchi, right greater than left. No wheezing. Abdomen:soft, nontender, nondistended, positive bowel sounds Extremities: 1+LE edema, No lymphangitis, no petechiae  Discharge Instructions     Medication List    STOP taking these medications       lisinopril 20 MG tablet  Commonly known as:  PRINIVIL,ZESTRIL     metFORMIN 1000 MG tablet  Commonly known as:  GLUCOPHAGE      TAKE these medications       ACCU-CHEK AVIVA PLUS W/DEVICE Kit  Test once per day and diagnosis code is 250.00     accu-chek soft touch lancets  Test once per day     aspirin 325 MG tablet  Take 325 mg by mouth daily.     dextrose 5 % SOLN 50 mL with cefTRIAXone 2 G SOLR 2 g  Inject 2 g into the vein daily. Last dose to be given on 03/26/13     donepezil 10 MG tablet  Commonly known as:  ARICEPT  Take 1 tablet (10 mg total) by mouth at bedtime as needed.     feeding supplement (RESOURCE BREEZE) Liqd  Take 1 Container by mouth 3 (three) times daily between meals.     food thickener Powd  Commonly known as:  THICK IT  Use with thin liquids to thicken as needed     glucose blood test strip  Commonly known as:  ACCU-CHEK AVIVA  Test once per day     glyBURIDE 5 MG tablet  Commonly known as:  DIABETA  Take 1 tablet (5 mg total) by mouth 2 (two) times daily with a meal.     meclizine 25 MG  tablet  Commonly known as:  ANTIVERT  Take 1 tablet (25 mg total) by mouth as needed for dizziness. FOR DIZZINESS     memantine 10 MG tablet  Commonly known as:  NAMENDA  Take 1 tablet (10 mg total) by mouth 2 (two) times daily.     multivitamin-lutein Caps capsule  Take 1 capsule by mouth daily.     silver sulfADIAZINE 1 % cream  Commonly known as:  SILVADENE  Apply topically 2 (two) times daily.     simvastatin 40 MG tablet  Commonly known as:  ZOCOR  Take 1 tablet (40 mg total) by mouth at bedtime.     sitaGLIPtin 50 MG tablet  Commonly known as:  JANUVIA  Take 1 tablet (50 mg total) by mouth daily.         The results of significant diagnostics from this hospitalization (including imaging, microbiology, ancillary and laboratory) are listed below for reference.    Significant Diagnostic Studies: Dg Chest Port 1 View  03/09/2013   CLINICAL DATA:  Cough and congestion. Chills. Altered mental status.  EXAM: PORTABLE CHEST - 1 VIEW  COMPARISON:  Two-view chest x-ray 01/03/2011.  FINDINGS: Heart size is normal. Atherosclerotic calcifications are again noted. Fused perihilar bronchitic changes are again seen. Superimposed ill-defined airspace disease is present at the lung bases, right greater than left. The visualized soft tissues and bony thorax are unremarkable.  IMPRESSION: 1. Acute on chronic perihilar bronchitic changes bilaterally. 2. Superimposed she right lower lobe airspace disease is concerning for early pneumonia. 3. Atherosclerosis.   Electronically Signed   By: Christian Santiago M.D.   On: 03/09/2013 18:14   Dg Swallowing Func-speech Pathology  03/13/2013   Christian [name], Christian [name]     03/13/2013  1:02 PM Objective Swallowing Evaluation: Modified Barium Swallowing Study   Patient Details  Name: Christian [name] MRN: [id number] Date of Birth: Aug 29, 1921  Today's Date: 03/13/2013 Time: 1015-1045 SLP Time Calculation (min): 30 min  Past Medical History:  Past Medical History  Diagnosis  Date  . Flashers or floaters, right eye   . Hyperlipidemia   . DJD of shoulder   . Aortic stenosis     severe sees Dr. Peter Martinique  . Mitral insufficiency   . RBBB (right bundle branch block)   . LAFB (left anterior fascicular block)   . Shingles   . Diabetes mellitus   . Carotid artery disease     last dopplers on 5/57/32 were stable, LICA with 2-02% stenosis  and RICA with 40-59%  . Acute arterial ischemic stroke, vertebrobasilar, thalamic 01/08   . Pneumonia 04/11  . Dementia    Past Surgical History:  Past Surgical History  Procedure Laterality Date  . Tonsillectomy    . Colonoscopy  11/04    clear no repeats needed  . Aortic valve replacement      used bovine valve in 2000 per Dr. Cyndia Bent  . Carotid endarterectomy  01/13/08    left per Dr. Kellie Simmering   HPI:  Christian [name] is a 80 y.o. male who presented to the ED with  AMS. Family then noted after he woke up he was "more confused"  than normal. By the time he got to the ED he was noted to be  hypoxic, febrile, tachycardic, and had developed cough. CXR shows  superimposed right LL airspace disease.     Assessment / Plan / Recommendation Clinical Impression  Dysphagia Diagnosis: Mild oral phase dysphagia;Mild pharyngeal  phase dysphagia Clinical impression: Patient presents with a mild sensory-motor  based oropharyngeal dysphagia characterized by a delayed swallow  initiation resulting in penetration/aspiration of thin liquids.  SLP attempted use of straw to facilitate chin tuck posture which  did not improve airway protection. Recommend continuation of  current diet with aspiration precautions to decrease risk.  Suspect some degree of chronic dysphagia as cause of acute PNA  however may be exacerbated by respiratory decline. Will f/u at  bedside for diet tolerance and to encourage use of strategies.  Education complete with patient and family. Pending improvements  make while in the hospital, patient may benefit from [facility] S[facility] f/u   to monitor for further improvements  and determine if repeat MBS  may be beneficial to evaluate for potential to advance diet.     Treatment Recommendation  Therapy as outlined in treatment plan below    Diet Recommendation Dysphagia 3 (Mechanical Soft);Nectar-thick  liquid   Liquid Administration via: Cup;No straw Medication Administration: Whole meds with puree Supervision: Patient able to self feed;Full supervision/cueing  for compensatory strategies Compensations: Slow rate;Small sips/bites Postural Changes and/or Swallow Maneuvers: Seated upright  90  degrees    Other  Recommendations Recommended Consults: MBS (prior to  advancing diet) Oral Care Recommendations: Oral care BID Other Recommendations: Order thickener from pharmacy;Prohibited  food (jello, ice cream, thin soups);Remove water pitcher   Follow Up Recommendations  Home health SLP    Frequency and Duration min 2x/week  2 weeks        General HPI: Christian [name] is a 80 y.o. male who presented to  the ED with AMS. Family then noted after he woke up he was "more  confused" than normal. By the time he got to the ED he was noted  to be hypoxic, febrile, tachycardic, and had developed cough. CXR  shows superimposed right LL airspace disease. Type of Study: Modified Barium Swallowing Study Reason for Referral: Objectively evaluate swallowing function Previous Swallow Assessment: none in chart Diet Prior to this Study: Dysphagia 3 (soft);Nectar-thick liquids Temperature Spikes Noted: No Respiratory Status: Room air History of Recent Intubation: No Behavior/Cognition: Alert;Cooperative;Pleasant  mood;Confused;Requires cueing Oral Cavity - Dentition:  (top partial) Oral Motor / Sensory Function: Impaired - see Bedside swallow  eval Self-Feeding Abilities: Able to feed self Patient Positioning: Upright in chair Baseline Vocal Quality: Clear Volitional Cough: Congested Volitional Swallow: Able to elicit Anatomy: Other (Comment) (? small osteophytes along lower  C-spine, MD not present) Pharyngeal  Secretions: Not observed secondary MBS    Reason for Referral Objectively evaluate swallowing function   Oral Phase Oral Preparation/Oral Phase Oral Phase: Impaired Oral - Nectar Oral - Nectar Cup: Delayed oral transit Oral - Thin Oral - Thin Cup: Delayed oral transit Oral - Thin Straw: Delayed oral transit Oral - Solids Oral - Mechanical Soft: Delayed oral transit Oral - Pill: Delayed oral transit   Pharyngeal Phase Pharyngeal Phase Pharyngeal Phase: Impaired Pharyngeal - Nectar Pharyngeal - Nectar Cup: Delayed swallow initiation;Premature  spillage to valleculae;Pharyngeal residue - valleculae;Reduced  tongue base retraction Pharyngeal - Thin Pharyngeal - Thin Cup: Delayed swallow initiation;Premature  spillage to pyriform sinuses;Reduced tongue base  retraction;Reduced airway/laryngeal  closure;Penetration/Aspiration before swallow Penetration/Aspiration details (thin cup): Material enters  airway, passes BELOW cords and not ejected out despite cough  attempt by patient Pharyngeal - Thin Straw: Delayed swallow initiation;Premature  spillage to pyriform sinuses;Reduced tongue base  retraction;Reduced airway/laryngeal  closure;Penetration/Aspiration before swallow (attempted chin  tuck, unsuccessful) Penetration/Aspiration details (thin straw): Material enters  airway, passes BELOW cords and not ejected out despite cough  attempt by patient Pharyngeal - Solids Pharyngeal - Puree: Delayed swallow initiation;Premature spillage  to valleculae;Pharyngeal residue - valleculae;Reduced tongue base  retraction Pharyngeal - Mechanical Soft: Delayed swallow  initiation;Premature spillage to valleculae;Reduced tongue base  retraction;Pharyngeal residue - valleculae Pharyngeal - Pill: Within functional limits (provided whole in  puree, patient masticated)  Cervical Esophageal Phase    GO   Christian [name], Christian [name] [phone]  Cervical Esophageal Phase Cervical Esophageal Phase: [facility]         Christian [name] 03/13/2013, 1:01 PM       Microbiology: Recent Results (from the past 240 hour(s))  CULTURE, BLOOD (ROUTINE X 2)     Status: None   Collection Time    03/09/13  5:42 PM      Result Value Ref Range Status   Specimen Description BLOOD LEFT ANTECUBITAL   Final   Special Requests BOTTLES DRAWN AEROBIC AND ANAEROBIC 5CC   Final   Culture  Setup Time     Final   Value: 03/10/2013 00:14     Performed at  [facility]   Culture     Final   Value:        BLOOD CULTURE RECEIVED NO GROWTH TO DATE CULTURE WILL BE HELD FOR 5 DAYS BEFORE ISSUING A FINAL NEGATIVE REPORT     Performed at [facility]   Report Status PENDING   Incomplete  CULTURE, BLOOD (ROUTINE X 2)     Status: None   Collection Time    03/09/13  5:45 PM      Result Value Ref Range Status   Specimen Description BLOOD WRIST RIGHT   Final   Special Requests BOTTLES DRAWN AEROBIC AND ANAEROBIC 5CC   Final   Culture  Setup Time     Final   Value: 03/10/2013 00:13     Performed at [facility]   Culture     Final   Value: STREPTOCOCCUS PNEUMONIAE     Note: Gram Stain Report Called to,Read Back By and Verified With: [name]_0 :17PM ON 03/10/13 BY DANTS     Performed at [facility]   Report Status 03/12/2013 FINAL   Final   Organism ID, Bacteria STREPTOCOCCUS PNEUMONIAE   Final  URINE CULTURE     Status: None   Collection Time    03/09/13  6:44 PM      Result Value Ref Range Status   Specimen Description URINE, CATHETERIZED   Final   Special Requests NONE   Final   Culture  Setup Time     Final   Value: 03/10/2013 01:12     Performed at [facility]     Final   Value: NO GROWTH     Performed at [facility]   Culture     Final   Value: NO GROWTH     Performed at [facility]   Report Status 03/10/2013 FINAL   Final  MRSA PCR SCREENING     Status: None   Collection Time    03/09/13  9:53 PM      Result Value Ref Range Status   MRSA by PCR NEGATIVE  NEGATIVE Final    Comment:            The GeneXpert MRSA Assay (FDA     approved for NASAL specimens     only), is one component of a     comprehensive MRSA colonization     surveillance program. It is not     intended to diagnose MRSA     infection nor to guide or     monitor treatment for     MRSA infections.  RESPIRATORY VIRUS PANEL     Status: None   Collection Time    03/10/13 11:18 AM      Result Value Ref Range Status   Source - RVPAN NASAL SWAB   Corrected   Comment: CORRECTED ON 03/08 AT 0143: PREVIOUSLY REPORTED AS NASAL SWAB   Respiratory Syncytial Virus A NOT DETECTED   Final   Respiratory Syncytial Virus B NOT DETECTED   Final   Influenza A NOT DETECTED   Final   Influenza B NOT DETECTED   Final   Parainfluenza 1 NOT DETECTED   Final   Parainfluenza 2 NOT DETECTED   Final   Parainfluenza 3 NOT DETECTED   Final   Metapneumovirus NOT DETECTED   Final   Rhinovirus NOT DETECTED   Final   Adenovirus NOT DETECTED   Final   Influenza A H1  NOT DETECTED   Final   Influenza A H3 NOT DETECTED   Final   Comment: (NOTE)           Normal Reference Range for each Analyte: NOT DETECTED     Testing performed using the Luminex xTAG Respiratory Viral Panel test     kit.     This test was developed and its performance characteristics determined     by [facility]. It has not been cleared or approved by the [place]     Food and Drug Administration. This test is used for clinical purposes.     It should not be regarded as investigational or for research. This     laboratory is certified under the [place] (CLIA) as qualified to perform high complexity     clinical laboratory testing.     Performed at [facility], BLOOD (ROUTINE X 2)     Status: None   Collection Time    03/12/13  9:18 AM      Result Value Ref Range Status   Specimen Description BLOOD LEFT ARM   Final   Special Requests BOTTLES DRAWN AEROBIC ONLY 3CC   Final    Culture  Setup Time     Final   Value: 03/12/2013 18:59     Performed at [facility]   Culture     Final   Value:        BLOOD CULTURE RECEIVED NO GROWTH TO DATE CULTURE WILL BE HELD FOR 5 DAYS BEFORE ISSUING A FINAL NEGATIVE REPORT     Performed at [facility]   Report Status PENDING   Incomplete  CULTURE, BLOOD (ROUTINE X 2)     Status: None   Collection Time    03/12/13  9:23 AM      Result Value Ref Range Status   Specimen Description BLOOD LEFT HAND   Final   Special Requests BOTTLES DRAWN AEROBIC ONLY 3CC   Final   Culture  Setup Time     Final   Value: 03/12/2013 18:58     Performed at [facility]   Culture     Final   Value:        BLOOD CULTURE RECEIVED NO GROWTH TO DATE CULTURE WILL BE HELD FOR 5 DAYS BEFORE ISSUING A FINAL NEGATIVE REPORT     Performed at [facility]   Report Status PENDING   Incomplete     Labs: Basic Metabolic Panel:  Recent Labs Lab 03/10/13 0020 03/10/13 0258 03/12/13 0551 03/13/13 0652 03/14/13 0534  NA 142 144 141 143 142  K 4.9 4.9 3.6* 3.7 3.8  CL 107 111 108 109 107  CO2 _0 GLUCOSE 213* 205* 106* 157* 180*  BUN 44* 43* _1 CREATININE 1.28 1.31 1.05 0.95 0.96  CALCIUM 8.9 8.7 8.3* 8.2* 8.3*   Liver Function Tests:  Recent Labs Lab 03/09/13 1738  AST 17  ALT 11  ALKPHOS 117  BILITOT 0.3  PROT 7.6  ALBUMIN 3.9   No results found for this basename: LIPASE, AMYLASE,  in the last 168 hours No results found for this basename: AMMONIA,  in the last 168 hours CBC:  Recent Labs Lab 03/09/13 1738 03/10/13 0258 03/12/13 0551 03/13/13 0652 03/14/13 0534  WBC 11.1* 13.8* 8.1 6.5 6.4  NEUTROABS 10.0*  --   --   --   --  HGB 13.0 9.5* 9.3* 9.1* 9.4*  HCT 39.4 29.0* 28.1* 27.2* 27.8*  MCV 92.1 90.3 89.8 88.0 88.0  PLT 175 137* 130* 139* 154   Cardiac Enzymes: No results found for this basename: CKTOTAL, CKMB, CKMBINDEX, TROPONINI,  in the last 168 hours BNP: No  components found with this basename: POCBNP,  CBG:  Recent Labs Lab 03/13/13 1600 03/13/13 2022 03/14/13 0727 03/14/13 1155 03/14/13 1517  GLUCAP 170* 354* 179* 152* 151*    Time coordinating discharge:  Greater than 30 minutes  Signed:  Yusuf [name], [place], DO [facility] Pager: [phone] 03/14/2013, 7:51 PM

## 2013-03-14 NOTE — CV Procedure (Signed)
See full report in [employer]; normal LV function; prosthetic aortic valve; no AI; moderate MR; moderate to severe TR; no vegetations. Christian MillersBrian Eitan Doubleday

## 2013-03-14 NOTE — Progress Notes (Signed)
Utilization review completed.  

## 2013-03-14 NOTE — Interval H&P Note (Signed)
History and Physical Interval Note:  03/14/2013 1:52 PM  Christian [name]  has presented today for surgery, with the diagnosis of endocarditis  The various methods of treatment have been discussed with the patient and family. After consideration of risks, benefits and other options for treatment, the patient has consented to  Procedure(s): TRANSESOPHAGEAL ECHOCARDIOGRAM (TEE) (N/A) as a surgical intervention .  The patient's history has been reviewed, patient examined, no change in status, stable for surgery.  I have reviewed the patient's chart and labs.  Questions were answered to the patient's satisfaction.     Olga MillersBrian Crenshaw

## 2013-03-14 NOTE — H&P (View-Only) (Signed)
[facility] PROGRESS NOTE  Christian [name] DQQ:[id number] DOB: 23-Jan-1921 DOA: 03/09/2013 PCP: Laurey Morale, MD  Assessment/Plan: Sepsis  -Present at the time of admission  -Secondary to pneumonia and bacteremia  -Improved  -Leukocytosis improved, afebrile, hemodynamically stable  Bacteremia  -Streptococcus pneumoniae  -Surveillance blood cultures-neg at 24 hrs -TEE on 03/14/13 -History of bioprosthetic aortic valve  -Discontinue vancomycin and aztreonam-  -continue ceftriaxone--2 g daily  CAP  -Positive urine Streptococcus pneumoniae antigen as well as bacteremia  -Ceftriaxone as discussed  -discontinue oseltamivir--respiratory virus panel negative  Acute toxic encephalopathy  -Secondary to sepsis  -Gradually improving  Acute kidney injury  -Continue IV fluids  -Gradually improving  -Baseline creatinine 0.8-1.1  Diabetes mellitus type 2, controlled  -12/26/2012 hemoglobin A1c 7.2  -Discontinue CBGs, NovoLog sliding scale Dysphagia  -Continue dysphagia 3 diet with nectar thickened liquids per speech therapy recommendations  Dementia  -Continue Aricept and Namenda  Family Communication: Updated daughter at bedside--total time 35 minutes Disposition Plan: Home when medically stable  Antibiotics:  Vancomycin and aztreonam 03/10/2013>>>> 03/12/2013  ceftriaxone 03/12/2013>>>          Procedures/Studies: Dg Chest Port 1 View  03/09/2013   CLINICAL DATA:  Cough and congestion. Chills. Altered mental status.  EXAM: PORTABLE CHEST - 1 VIEW  COMPARISON:  Two-view chest x-ray 01/03/2011.  FINDINGS: Heart size is normal. Atherosclerotic calcifications are again noted. Fused perihilar bronchitic changes are again seen. Superimposed ill-defined airspace disease is present at the lung bases, right greater than left. The visualized soft tissues and bony thorax are unremarkable.  IMPRESSION: 1. Acute on chronic perihilar bronchitic changes bilaterally. 2. Superimposed she right  lower lobe airspace disease is concerning for early pneumonia. 3. Atherosclerosis.   Electronically Signed   By: Lawrence Santiago M.D.   On: 03/09/2013 18:14   Dg Swallowing Func-speech Pathology  03/13/2013   [name], CCC-SLP     03/13/2013  1:02 PM Objective Swallowing Evaluation: Modified Barium Swallowing Study   Patient Details  Name: Christian [name] MRN: [id number] Date of Birth: 05-Sep-1921  Today's Date: 03/13/2013 Time: 1015-1045 SLP Time Calculation (min): 30 min  Past Medical History:  Past Medical History  Diagnosis Date  . Flashers or floaters, right eye   . Hyperlipidemia   . DJD of shoulder   . Aortic stenosis     severe sees Dr. Peter Martinique  . Mitral insufficiency   . RBBB (right bundle branch block)   . LAFB (left anterior fascicular block)   . Shingles   . Diabetes mellitus   . Carotid artery disease     last dopplers on 4/48/18 were stable, LICA with 5-63% stenosis  and RICA with 40-59%  . Acute arterial ischemic stroke, vertebrobasilar, thalamic 01/08   . Pneumonia 04/11  . Dementia    Past Surgical History:  Past Surgical History  Procedure Laterality Date  . Tonsillectomy    . Colonoscopy  11/04    clear no repeats needed  . Aortic valve replacement      used bovine valve in 2000 per Dr. Cyndia Bent  . Carotid endarterectomy  01/13/08    left per Dr. Kellie Simmering   HPI:  [name] is a 80 y.o. male who presented to the ED with  AMS. Family then noted after he woke up he was "more confused"  than normal. By the time he got to the ED he was noted to be  hypoxic, febrile, tachycardic, and had developed cough. CXR shows  superimposed  right LL airspace disease.     Assessment / Plan / Recommendation Clinical Impression  Dysphagia Diagnosis: Mild oral phase dysphagia;Mild pharyngeal  phase dysphagia Clinical impression: Patient presents with a mild sensory-motor  based oropharyngeal dysphagia characterized by a delayed swallow  initiation resulting in penetration/aspiration of thin liquids.  SLP attempted  use of straw to facilitate chin tuck posture which  did not improve airway protection. Recommend continuation of  current diet with aspiration precautions to decrease risk.  Suspect some degree of chronic dysphagia as cause of acute PNA  however may be exacerbated by respiratory decline. Will f/u at  bedside for diet tolerance and to encourage use of strategies.  Education complete with patient and family. Pending improvements  make while in the hospital, patient may benefit from [facility] SLP f/u   to monitor for further improvements and determine if repeat MBS  may be beneficial to evaluate for potential to advance diet.     Treatment Recommendation  Therapy as outlined in treatment plan below    Diet Recommendation Dysphagia 3 (Mechanical Soft);Nectar-thick  liquid   Liquid Administration via: Cup;No straw Medication Administration: Whole meds with puree Supervision: Patient able to self feed;Full supervision/cueing  for compensatory strategies Compensations: Slow rate;Small sips/bites Postural Changes and/or Swallow Maneuvers: Seated upright 90  degrees    Other  Recommendations Recommended Consults: MBS (prior to  advancing diet) Oral Care Recommendations: Oral care BID Other Recommendations: Order thickener from pharmacy;Prohibited  food (jello, ice cream, thin soups);Remove water pitcher   Follow Up Recommendations  Home health SLP    Frequency and Duration min 2x/week  2 weeks        General HPI: [name] is a 80 y.o. male who presented to  the ED with AMS. Family then noted after he woke up he was "more  confused" than normal. By the time he got to the ED he was noted  to be hypoxic, febrile, tachycardic, and had developed cough. CXR  shows superimposed right LL airspace disease. Type of Study: Modified Barium Swallowing Study Reason for Referral: Objectively evaluate swallowing function Previous Swallow Assessment: none in chart Diet Prior to this Study: Dysphagia 3 (soft);Nectar-thick liquids Temperature  Spikes Noted: No Respiratory Status: Room air History of Recent Intubation: No Behavior/Cognition: Alert;Cooperative;Pleasant  mood;Confused;Requires cueing Oral Cavity - Dentition:  (top partial) Oral Motor / Sensory Function: Impaired - see Bedside swallow  eval Self-Feeding Abilities: Able to feed self Patient Positioning: Upright in chair Baseline Vocal Quality: Clear Volitional Cough: Congested Volitional Swallow: Able to elicit Anatomy: Other (Comment) (? small osteophytes along lower  C-spine, MD not present) Pharyngeal Secretions: Not observed secondary MBS    Reason for Referral Objectively evaluate swallowing function   Oral Phase Oral Preparation/Oral Phase Oral Phase: Impaired Oral - Nectar Oral - Nectar Cup: Delayed oral transit Oral - Thin Oral - Thin Cup: Delayed oral transit Oral - Thin Straw: Delayed oral transit Oral - Solids Oral - Mechanical Soft: Delayed oral transit Oral - Pill: Delayed oral transit   Pharyngeal Phase Pharyngeal Phase Pharyngeal Phase: Impaired Pharyngeal - Nectar Pharyngeal - Nectar Cup: Delayed swallow initiation;Premature  spillage to valleculae;Pharyngeal residue - valleculae;Reduced  tongue base retraction Pharyngeal - Thin Pharyngeal - Thin Cup: Delayed swallow initiation;Premature  spillage to pyriform sinuses;Reduced tongue base  retraction;Reduced airway/laryngeal  closure;Penetration/Aspiration before swallow Penetration/Aspiration details (thin cup): Material enters  airway, passes BELOW cords and not ejected out despite cough  attempt by patient Pharyngeal - Thin Straw: Delayed swallow  initiation;Premature  spillage to pyriform sinuses;Reduced tongue base  retraction;Reduced airway/laryngeal  closure;Penetration/Aspiration before swallow (attempted chin  tuck, unsuccessful) Penetration/Aspiration details (thin straw): Material enters  airway, passes BELOW cords and not ejected out despite cough  attempt by patient Pharyngeal - Solids Pharyngeal - Puree: Delayed  swallow initiation;Premature spillage  to valleculae;Pharyngeal residue - valleculae;Reduced tongue base  retraction Pharyngeal - Mechanical Soft: Delayed swallow  initiation;Premature spillage to valleculae;Reduced tongue base  retraction;Pharyngeal residue - valleculae Pharyngeal - Pill: Within functional limits (provided whole in  puree, patient masticated)  Cervical Esophageal Phase    GO   Gabriel Rainwater MA, CCC-SLP [phone]  Cervical Esophageal Phase Cervical Esophageal Phase: [facility]         McCoy Leah Meryl 03/13/2013, 1:01 PM          Subjective: Patient is much more alert today. Denies and fevers, chills, chest pain, shortness breath, nausea, vomiting, diarrhea, abdominal pain. No headaches or dizziness to  Objective: Filed Vitals:   03/12/13 2011 03/12/13 2102 03/13/13 0412 03/13/13 1101  BP: 130/58  147/69 135/52  Pulse: 103  111 92  Temp: 98.7 F (37.1 C)  98.3 F (36.8 C) 98.8 F (37.1 C)  TempSrc: Oral  Oral   Resp: 18  18 18   Height:      Weight:      SpO2: 92% 91% 91% 94%    Intake/Output Summary (Last 24 hours) at 03/13/13 1915 Last data filed at 03/13/13 1852  Gross per 24 hour  Intake    120 ml  Output   1700 ml  Net  -1580 ml   Weight change:  Exam:   General:  Pt is alert, follows commands appropriately, not in acute distress  HEENT: No icterus, No thrush, No neck mass, [place]/AT  Cardiovascular: RRR, S1/S2, no rubs, no gallops  Respiratory: Bilateral scattered rhonchi, right at the left. No wheezing. Good air movement  Abdomen: Soft/+BS, non tender, non distended, no guarding  Extremities: 1+LE edema, No lymphangitis, No petechiae, No rashes, no synovitis  Data Reviewed: Basic Metabolic Panel:  Recent Labs Lab 03/09/13 1738 03/10/13 0020 03/10/13 0258 03/12/13 0551 03/13/13 0652  NA 141 142 144 141 143  K 6.7* 4.9 4.9 3.6* 3.7  CL 103 107 111 108 109  CO2 22 21 20 21 22   GLUCOSE 220* 213* 205* 106* 157*  BUN 49* 44* 43* 20 17  CREATININE  1.41* 1.28 1.31 1.05 0.95  CALCIUM 9.9 8.9 8.7 8.3* 8.2*   Liver Function Tests:  Recent Labs Lab 03/09/13 1738  AST 17  ALT 11  ALKPHOS 117  BILITOT 0.3  PROT 7.6  ALBUMIN 3.9   No results found for this basename: LIPASE, AMYLASE,  in the last 168 hours No results found for this basename: AMMONIA,  in the last 168 hours CBC:  Recent Labs Lab 03/09/13 1738 03/10/13 0258 03/12/13 0551 03/13/13 0652  WBC 11.1* 13.8* 8.1 6.5  NEUTROABS 10.0*  --   --   --   HGB 13.0 9.5* 9.3* 9.1*  HCT 39.4 29.0* 28.1* 27.2*  MCV 92.1 90.3 89.8 88.0  PLT 175 137* 130* 139*   Cardiac Enzymes: No results found for this basename: CKTOTAL, CKMB, CKMBINDEX, TROPONINI,  in the last 168 hours BNP: No components found with this basename: POCBNP,  CBG:  Recent Labs Lab 03/12/13 2358 03/13/13 0413 03/13/13 0738 03/13/13 1056 03/13/13 1600  GLUCAP 169* 145* 143* 207* 170*    Recent Results (from the past 240 hour(s))  CULTURE, BLOOD (ROUTINE X 2)     Status: None   Collection Time    03/09/13  5:42 PM      Result Value Ref Range Status   Specimen Description BLOOD LEFT ANTECUBITAL   Final   Special Requests BOTTLES DRAWN AEROBIC AND ANAEROBIC 5CC   Final   Culture  Setup Time     Final   Value: 03/10/2013 00:14     Performed at [facility]   Culture     Final   Value:        BLOOD CULTURE RECEIVED NO GROWTH TO DATE CULTURE WILL BE HELD FOR 5 DAYS BEFORE ISSUING A FINAL NEGATIVE REPORT     Performed at [facility]   Report Status PENDING   Incomplete  CULTURE, BLOOD (ROUTINE X 2)     Status: None   Collection Time    03/09/13  5:45 PM      Result Value Ref Range Status   Specimen Description BLOOD WRIST RIGHT   Final   Special Requests BOTTLES DRAWN AEROBIC AND ANAEROBIC 5CC   Final   Culture  Setup Time     Final   Value: 03/10/2013 00:13     Performed at [facility]   Culture     Final   Value: STREPTOCOCCUS PNEUMONIAE     Note: Gram Stain Report  Called to,Read Back By and Verified With: [name]@1 :17PM ON 03/10/13 BY DANTS     Performed at [facility]   Report Status 03/12/2013 FINAL   Final   Organism ID, Bacteria STREPTOCOCCUS PNEUMONIAE   Final  URINE CULTURE     Status: None   Collection Time    03/09/13  6:44 PM      Result Value Ref Range Status   Specimen Description URINE, CATHETERIZED   Final   Special Requests NONE   Final   Culture  Setup Time     Final   Value: 03/10/2013 01:12     Performed at [place]     Final   Value: NO GROWTH     Performed at [facility]   Culture     Final   Value: NO GROWTH     Performed at [facility]   Report Status 03/10/2013 FINAL   Final  MRSA PCR SCREENING     Status: None   Collection Time    03/09/13  9:53 PM      Result Value Ref Range Status   MRSA by PCR NEGATIVE  NEGATIVE Final   Comment:            The GeneXpert MRSA Assay (FDA     approved for NASAL specimens     only), is one component of a     comprehensive MRSA colonization     surveillance program. It is not     intended to diagnose MRSA     infection nor to guide or     monitor treatment for     MRSA infections.  RESPIRATORY VIRUS PANEL     Status: None   Collection Time    03/10/13 11:18 AM      Result Value Ref Range Status   Source - RVPAN NASAL SWAB   Corrected   Comment: CORRECTED ON 03/08 AT 0143: PREVIOUSLY REPORTED AS NASAL SWAB   Respiratory Syncytial Virus A NOT DETECTED   Final   Respiratory Syncytial Virus B NOT DETECTED  Final   Influenza A NOT DETECTED   Final   Influenza B NOT DETECTED   Final   Parainfluenza 1 NOT DETECTED   Final   Parainfluenza 2 NOT DETECTED   Final   Parainfluenza 3 NOT DETECTED   Final   Metapneumovirus NOT DETECTED   Final   Rhinovirus NOT DETECTED   Final   Adenovirus NOT DETECTED   Final   Influenza A H1 NOT DETECTED   Final   Influenza A H3 NOT DETECTED   Final   Comment: (NOTE)           Normal  Reference Range for each Analyte: NOT DETECTED     Testing performed using the Luminex xTAG Respiratory Viral Panel test     kit.     This test was developed and its performance characteristics determined     by [facility]. It has not been cleared or approved by the [place]     Food and Drug Administration. This test is used for clinical purposes.     It should not be regarded as investigational or for research. This     laboratory is certified under the [place] (CLIA) as qualified to perform high complexity     clinical laboratory testing.     Performed at [place], BLOOD (ROUTINE X 2)     Status: None   Collection Time    03/12/13  9:18 AM      Result Value Ref Range Status   Specimen Description BLOOD LEFT ARM   Final   Special Requests BOTTLES DRAWN AEROBIC ONLY 3CC   Final   Culture  Setup Time     Final   Value: 03/12/2013 18:59     Performed at [facility]   Culture     Final   Value:        BLOOD CULTURE RECEIVED NO GROWTH TO DATE CULTURE WILL BE HELD FOR 5 DAYS BEFORE ISSUING A FINAL NEGATIVE REPORT     Performed at [facility]   Report Status PENDING   Incomplete  CULTURE, BLOOD (ROUTINE X 2)     Status: None   Collection Time    03/12/13  9:23 AM      Result Value Ref Range Status   Specimen Description BLOOD LEFT HAND   Final   Special Requests BOTTLES DRAWN AEROBIC ONLY 3CC   Final   Culture  Setup Time     Final   Value: 03/12/2013 18:58     Performed at [facility]   Culture     Final   Value:        BLOOD CULTURE RECEIVED NO GROWTH TO DATE CULTURE WILL BE HELD FOR 5 DAYS BEFORE ISSUING A FINAL NEGATIVE REPORT     Performed at [facility]   Report Status PENDING   Incomplete     Scheduled Meds: . aspirin  325 mg Oral Daily  . budesonide (PULMICORT) nebulizer solution  0.25 mg Nebulization BID  . cefTRIAXone (ROCEPHIN)  IV  2 g Intravenous Q24H  .  donepezil  10 mg Oral QHS  . feeding supplement (RESOURCE BREEZE)  1 Container Oral TID BM  . heparin  5,000 Units Subcutaneous 3 times per day  . insulin aspart  0-9 Units Subcutaneous 6 times per day  . memantine  10 mg Oral BID  . multivitamin-lutein  1 capsule Oral Daily  .  simvastatin  40 mg Oral QHS   Continuous Infusions: . sodium chloride 20 mL/hr at 03/11/13 2153     Tenille Morrill, [place], DO  [facility] Pager [phone]  If 7PM-7AM, please contact night-coverage [email or web address] Password [email or web address] 03/13/2013, 7:15 PM   LOS: 4 days

## 2013-03-14 NOTE — Progress Notes (Signed)
Peripherally Inserted Central Catheter/Midline Placement  The IV Nurse has discussed with the patient and/or persons authorized to consent for the patient, the purpose of this procedure and the potential benefits and risks involved with this procedure.  The benefits include less needle sticks, lab draws from the catheter and patient may be discharged home with the catheter.  Risks include, but not limited to, infection, bleeding, blood clot (thrombus formation), and puncture of an artery; nerve damage and irregular heat beat.  Alternatives to this procedure were also discussed.  PICC/Midline Placement Documentation        Stacie GlazeJoyce, Lashell Moffitt Horton 03/14/2013, 4:59 PM

## 2013-03-14 NOTE — Progress Notes (Signed)
  Echocardiogram Echocardiogram Transesophageal has been performed.  Georgian CoWILLIAMS, Abigaile Rossie 03/14/2013, 2:26 PM

## 2013-03-15 ENCOUNTER — Encounter (HOSPITAL_COMMUNITY): Payer: Self-pay | Admitting: Cardiology

## 2013-03-15 DIAGNOSIS — R5381 Other malaise: Secondary | ICD-10-CM

## 2013-03-15 DIAGNOSIS — R5383 Other fatigue: Secondary | ICD-10-CM

## 2013-03-15 NOTE — Progress Notes (Signed)
Physical Therapy Treatment Patient Details Name: Christian [name] Chriswell MRN: [id number] DOB: [birth year] Today's Date: 03/15/2013 Time: 1530-1600 PT Time Calculation (min): 30 min  PT Assessment / Plan / Recommendation  History of Present Illness Christian [name] Isola is a 80 y.o. male who presents to the ED with AMS.  Symptoms onset this afternoon, was at baseline before he took a nap.  Family then notes after he woke up he was "more confused" than normal.  By the time he got to the ED he was noted to be hypoxic, febrile, tachycardic, and had developed cough   PT Comments   Patient progressing with ability to walk in hallway.  Encouraged family close follow up at rehab and passing along information to staff there for maximal benefit to patient and shorter length of stay there.  Wife in room and attempting to help patient despite just discharged herself yesterday.  Daughter and son supportive.  Follow Up Recommendations  SNF;Supervision/Assistance - 24 hour           Equipment Recommendations  None recommended by PT    Recommendations for Other Services  None  Frequency Min 3X/week   Progress towards PT Goals Progress towards PT goals: Progressing toward goals  Plan Current plan remains appropriate    Precautions / Restrictions Precautions Precautions: Fall   Pertinent Vitals/Pain denies pain, HR 98 with activity, dyspneic SpO2 93    Mobility  Bed Mobility General bed mobility comments: pt in chair Transfers Overall transfer level: Needs assistance Equipment used: Rolling walker (2 wheeled) Transfers: Sit to/from Stand Sit to Stand: Mod assist;Min assist General transfer comment: initially from recliner needed increased assist with posterior bias, then progressed to min assist with subsequent trial Ambulation/Gait Ambulation/Gait assistance: Min assist Ambulation Distance (Feet): 55 Feet Assistive device: Rolling walker (2 wheeled) Gait Pattern/deviations: Decreased stride length;Trunk  flexed;Shuffle;Step-through pattern General Gait Details: frequent cues for head up, stepping inside walker and assist with turning walker    Exercises General Exercises - Upper Extremity Shoulder Flexion: AROM;Both;5 reps;Seated General Exercises - Lower Extremity Heel Slides: AROM;Both;10 reps;Seated Hip Flexion/Marching: AROM;Both;10 reps;Seated Mini-Sqauts: AROM;10 reps;Standing     PT Goals (current goals can now be found in the care plan section)    Visit Information  Last PT Received On: 03/15/13 Assistance Needed: +1 History of Present Illness: Christian [name] Struss is a 80 y.o. male who presents to the ED with AMS.  Symptoms onset this afternoon, was at baseline before he took a nap.  Family then notes after he woke up he was "more confused" than normal.  By the time he got to the ED he was noted to be hypoxic, febrile, tachycardic, and had developed cough    Subjective Data      Cognition  Cognition Arousal/Alertness: Awake/alert Behavior During Therapy: WFL for tasks assessed/performed Overall Cognitive Status: History of cognitive impairments - at baseline    Balance  Balance Overall balance assessment: Needs assistance Standing balance support: Bilateral upper extremity supported Standing balance-Leahy Scale: Poor  End of Session PT - End of Session Equipment Utilized During Treatment: Gait belt Activity Tolerance: Patient tolerated treatment well Patient left: in chair;with call bell/phone within reach;with family/visitor present   GP     [facility] Adventist HospitalWYNN,CYNDI 03/15/2013, 5:07 PM Sheran Lawlessyndi Leocadia Idleman, PT [phone] 03/15/2013

## 2013-03-15 NOTE — Progress Notes (Signed)
Speech Language Pathology Treatment: Dysphagia  Patient Details Name: Christian [name] Petre MRN: [id number] DOB: [birth year] Today's Date: 03/15/2013 Time: [id number] SLP Time Calculation (min): 35 min  Assessment / Plan / Recommendation Clinical Impression  Pt seated in chair, wife and daughter present.  Pt noted to have nectar thick beverage, with straw, within reach.  Straw was removed and thrown away, as MBS recommendations included no straw use.  Intermittent cough noted following nectar thick liquid. Pt responsive to cues to decrease rate and sip size, but requires ongoing cues to do so. Pt has upper partial which is significantly ill-fitting.  Will downgrade diet to dys 2 and encourage removal of partial during po intake, for pt safety. [facility]  Also provided information on natural thickeners to daughter, and encouraged them to take precaution sheet with them at DC, which is pending placement.  Recommend continued SLP intervention at next venue, for diet tolerance and education.     HPI HPI: Christian [name] Diosdado is a 80 y.o. male who presented to the ED with AMS. Family then noted after he woke up he was "more confused" than normal. By the time he got to the ED he was noted to be hypoxic, febrile, tachycardic, and had developed cough. CXR shows superimposed right LL airspace disease. MBS completed 03/13/13.   Pertinent Vitals VSS  SLP Plan  Continue with current plan of care    Recommendations Diet recommendations: Dysphagia 2 (fine chop);Nectar-thick liquid Liquids provided via: Cup;No straw Medication Administration: Whole meds with puree Supervision: Patient able to self feed;Full supervision/cueing for compensatory strategies Compensations: Slow rate;Small sips/bites (remove ill-fitting partial when eating) Postural Changes and/or Swallow Maneuvers: Seated upright 90 degrees              Oral Care Recommendations: Oral care BID Follow up  Recommendations: Skilled Nursing facility Plan: Continue with current plan of care    GO   Sallie Staron B. Murvin NatalBueche, Eastwind Surgical LLCMSP, CCC-SLP [phone]   Leigh AuroraBueche, Cloyce Paterson Brown 03/15/2013, 12:13 PM

## 2013-03-15 NOTE — Progress Notes (Signed)
[facility] PROGRESS NOTE  Christian [name] GGY:[id number] DOB: [birth year] DOA: 03/09/2013 PCP: Laurey Morale, MD  Assessment/Plan: Sepsis  -Present at the time of admission  -Secondary to pneumonia and bacteremia  -Improved  -Leukocytosis improved, afebrile, hemodynamically stable  Bacteremia  -Streptococcus pneumoniae  -Surveillance blood cultures-neg at 24 hrs -TEE on 03/14/13 -History of bioprosthetic aortic valve  -Discontinue vancomycin and aztreonam-  -continue ceftriaxone--2 g daily  CAP  -Positive urine Streptococcus pneumoniae antigen as well as bacteremia  -Ceftriaxone as discussed  -discontinue oseltamivir--respiratory virus panel negative  Acute toxic encephalopathy  -Secondary to sepsis  -Gradually improving  Acute kidney injury  -Continue IV fluids  -Gradually improving  -Baseline creatinine 0.8-1.1  Diabetes mellitus type 2, controlled  -12/26/2012 hemoglobin A1c 7.2  -Discontinue CBGs, NovoLog sliding scale Dysphagia  -Continue dysphagia 3 diet with nectar thickened liquids per speech therapy recommendations  Dementia  -Continue Aricept and Namenda  Family Communication: Updated daughter at bedside--total time 35 minutes Disposition Plan: Awaiting SNF placement Antibiotics:  Vancomycin and aztreonam 03/10/2013>>>> 03/12/2013  ceftriaxone 03/12/2013>>>     Procedures/Studies: Dg Chest Port 1 View  03/09/2013   CLINICAL DATA:  Cough and congestion. Chills. Altered mental status.  EXAM: PORTABLE CHEST - 1 VIEW  COMPARISON:  Two-view chest x-ray 01/03/2011.  FINDINGS: Heart size is normal. Atherosclerotic calcifications are again noted. Fused perihilar bronchitic changes are again seen. Superimposed ill-defined airspace disease is present at the lung bases, right greater than left. The visualized soft tissues and bony thorax are unremarkable.  IMPRESSION: 1. Acute on chronic perihilar bronchitic changes bilaterally. 2. Superimposed she right lower lobe  airspace disease is concerning for early pneumonia. 3. Atherosclerosis.   Electronically Signed   By: Lawrence Santiago M.D.   On: 03/09/2013 18:14   Dg Swallowing Func-speech Pathology  03/13/2013   [name], CCC-SLP     03/13/2013  1:02 PM Objective Swallowing Evaluation: Modified Barium Swallowing Study   Patient Details  Name: Christian [name] MRN: [id number] Date of Birth: 1921/03/30  Today's Date: 03/13/2013 Time: 1015-1045 SLP Time Calculation (min): 30 min  Past Medical History:  Past Medical History  Diagnosis Date  . Flashers or floaters, right eye   . Hyperlipidemia   . DJD of shoulder   . Aortic stenosis     severe sees Dr. Peter Martinique  . Mitral insufficiency   . RBBB (right bundle branch block)   . LAFB (left anterior fascicular block)   . Shingles   . Diabetes mellitus   . Carotid artery disease     last dopplers on 09/03/91 were stable, LICA with 7-16% stenosis  and RICA with 40-59%  . Acute arterial ischemic stroke, vertebrobasilar, thalamic 01/08   . Pneumonia 04/11  . Dementia    Past Surgical History:  Past Surgical History  Procedure Laterality Date  . Tonsillectomy    . Colonoscopy  11/04    clear no repeats needed  . Aortic valve replacement      used bovine valve in 2000 per Dr. Cyndia Bent  . Carotid endarterectomy  01/13/08    left per Dr. Kellie Simmering   HPI:  Christian [name] is a 80 y.o. male who presented to the ED with  AMS. Family then noted after he woke up he was "more confused"  than normal. By the time he got to the ED he was noted to be  hypoxic, febrile, tachycardic, and had developed cough. CXR shows  superimposed right LL airspace disease.  Assessment / Plan / Recommendation Clinical Impression  Dysphagia Diagnosis: Mild oral phase dysphagia;Mild pharyngeal  phase dysphagia Clinical impression: Patient presents with a mild sensory-motor  based oropharyngeal dysphagia characterized by a delayed swallow  initiation resulting in penetration/aspiration of thin liquids.  SLP attempted use of  straw to facilitate chin tuck posture which  did not improve airway protection. Recommend continuation of  current diet with aspiration precautions to decrease risk.  Suspect some degree of chronic dysphagia as cause of acute PNA  however may be exacerbated by respiratory decline. Will f/u at  bedside for diet tolerance and to encourage use of strategies.  Education complete with patient and family. Pending improvements  make while in the hospital, patient may benefit from [facility] SLP f/u   to monitor for further improvements and determine if repeat MBS  may be beneficial to evaluate for potential to advance diet.     Treatment Recommendation  Therapy as outlined in treatment plan below    Diet Recommendation Dysphagia 3 (Mechanical Soft);Nectar-thick  liquid   Liquid Administration via: Cup;No straw Medication Administration: Whole meds with puree Supervision: Patient able to self feed;Full supervision/cueing  for compensatory strategies Compensations: Slow rate;Small sips/bites Postural Changes and/or Swallow Maneuvers: Seated upright 90  degrees    Other  Recommendations Recommended Consults: MBS (prior to  advancing diet) Oral Care Recommendations: Oral care BID Other Recommendations: Order thickener from pharmacy;Prohibited  food (jello, ice cream, thin soups);Remove water pitcher   Follow Up Recommendations  Home health SLP    Frequency and Duration min 2x/week  2 weeks        General HPI: Christian [name] is a 80 y.o. male who presented to  the ED with AMS. Family then noted after he woke up he was "more  confused" than normal. By the time he got to the ED he was noted  to be hypoxic, febrile, tachycardic, and had developed cough. CXR  shows superimposed right LL airspace disease. Type of Study: Modified Barium Swallowing Study Reason for Referral: Objectively evaluate swallowing function Previous Swallow Assessment: none in chart Diet Prior to this Study: Dysphagia 3 (soft);Nectar-thick liquids Temperature Spikes  Noted: No Respiratory Status: Room air History of Recent Intubation: No Behavior/Cognition: Alert;Cooperative;Pleasant  mood;Confused;Requires cueing Oral Cavity - Dentition:  (top partial) Oral Motor / Sensory Function: Impaired - see Bedside swallow  eval Self-Feeding Abilities: Able to feed self Patient Positioning: Upright in chair Baseline Vocal Quality: Clear Volitional Cough: Congested Volitional Swallow: Able to elicit Anatomy: Other (Comment) (? small osteophytes along lower  C-spine, MD not present) Pharyngeal Secretions: Not observed secondary MBS    Reason for Referral Objectively evaluate swallowing function   Oral Phase Oral Preparation/Oral Phase Oral Phase: Impaired Oral - Nectar Oral - Nectar Cup: Delayed oral transit Oral - Thin Oral - Thin Cup: Delayed oral transit Oral - Thin Straw: Delayed oral transit Oral - Solids Oral - Mechanical Soft: Delayed oral transit Oral - Pill: Delayed oral transit   Pharyngeal Phase Pharyngeal Phase Pharyngeal Phase: Impaired Pharyngeal - Nectar Pharyngeal - Nectar Cup: Delayed swallow initiation;Premature  spillage to valleculae;Pharyngeal residue - valleculae;Reduced  tongue base retraction Pharyngeal - Thin Pharyngeal - Thin Cup: Delayed swallow initiation;Premature  spillage to pyriform sinuses;Reduced tongue base  retraction;Reduced airway/laryngeal  closure;Penetration/Aspiration before swallow Penetration/Aspiration details (thin cup): Material enters  airway, passes BELOW cords and not ejected out despite cough  attempt by patient Pharyngeal - Thin Straw: Delayed swallow initiation;Premature  spillage to pyriform sinuses;Reduced tongue base  retraction;Reduced airway/laryngeal  closure;Penetration/Aspiration before swallow (attempted chin  tuck, unsuccessful) Penetration/Aspiration details (thin straw): Material enters  airway, passes BELOW cords and not ejected out despite cough  attempt by patient Pharyngeal - Solids Pharyngeal - Puree: Delayed swallow  initiation;Premature spillage  to valleculae;Pharyngeal residue - valleculae;Reduced tongue base  retraction Pharyngeal - Mechanical Soft: Delayed swallow  initiation;Premature spillage to valleculae;Reduced tongue base  retraction;Pharyngeal residue - valleculae Pharyngeal - Pill: Within functional limits (provided whole in  puree, patient masticated)  Cervical Esophageal Phase    GO   Gabriel Rainwater MA, CCC-SLP [phone]  Cervical Esophageal Phase Cervical Esophageal Phase: [facility]         McCoy Leah Meryl 03/13/2013, 1:01 PM          Subjective: Patient is much more alert today. Denies and fevers, chills, chest pain, shortness breath, nausea, vomiting, diarrhea, abdominal pain. No headaches or dizziness to  Objective: Filed Vitals:   03/14/13 2211 03/15/13 0549 03/15/13 0745 03/15/13 1300  BP:  138/64 127/76 135/80  Pulse:  88 86 81  Temp:  97.9 F (36.6 C) 98.1 F (36.7 C) 98.1 F (36.7 C)  TempSrc:  Oral Oral Oral  Resp:  _0 Height:      Weight:      SpO2: 95% 94% 95% 96%    Intake/Output Summary (Last 24 hours) at 03/15/13 1646 Last data filed at 03/15/13 1343  Gross per 24 hour  Intake    860 ml  Output   1525 ml  Net   -665 ml   Weight change:  Exam:   General:  Pt is alert, follows commands appropriately, not in acute distress  HEENT: No icterus, No thrush, No neck mass, [place]/AT  Cardiovascular: RRR, S1/S2, no rubs, no gallops  Respiratory: Bilateral scattered rhonchi, right at the left. No wheezing. Good air movement  Abdomen: Soft/+BS, non tender, non distended, no guarding  Extremities: 1+LE edema, No lymphangitis, No petechiae, No rashes, no synovitis  Data Reviewed: Basic Metabolic Panel:  Recent Labs Lab 03/10/13 0020 03/10/13 0258 03/12/13 0551 03/13/13 0652 03/14/13 0534  NA 142 144 141 143 142  K 4.9 4.9 3.6* 3.7 3.8  CL 107 111 108 109 107  CO2 _1 GLUCOSE 213* 205* 106* 157* 180*  BUN 44* 43* _2 CREATININE 1.28  1.31 1.05 0.95 0.96  CALCIUM 8.9 8.7 8.3* 8.2* 8.3*   Liver Function Tests:  Recent Labs Lab 03/09/13 1738  AST 17  ALT 11  ALKPHOS 117  BILITOT 0.3  PROT 7.6  ALBUMIN 3.9   No results found for this basename: LIPASE, AMYLASE,  in the last 168 hours No results found for this basename: AMMONIA,  in the last 168 hours CBC:  Recent Labs Lab 03/09/13 1738 03/10/13 0258 03/12/13 0551 03/13/13 0652 03/14/13 0534  WBC 11.1* 13.8* 8.1 6.5 6.4  NEUTROABS 10.0*  --   --   --   --   HGB 13.0 9.5* 9.3* 9.1* 9.4*  HCT 39.4 29.0* 28.1* 27.2* 27.8*  MCV 92.1 90.3 89.8 88.0 88.0  PLT 175 137* 130* 139* 154   Cardiac Enzymes: No results found for this basename: CKTOTAL, CKMB, CKMBINDEX, TROPONINI,  in the last 168 hours BNP: No components found with this basename: POCBNP,  CBG:  Recent Labs Lab 03/13/13 1600 03/13/13 2022 03/14/13 0727 03/14/13 1155 03/14/13 1517  GLUCAP 170* 354* 179* 152* 151*    Recent Results (from the past 240  hour(s))  CULTURE, BLOOD (ROUTINE X 2)     Status: None   Collection Time    03/09/13  5:42 PM      Result Value Ref Range Status   Specimen Description BLOOD LEFT ANTECUBITAL   Final   Special Requests BOTTLES DRAWN AEROBIC AND ANAEROBIC 5CC   Final   Culture  Setup Time     Final   Value: 03/10/2013 00:14     Performed at [facility]   Culture     Final   Value:        BLOOD CULTURE RECEIVED NO GROWTH TO DATE CULTURE WILL BE HELD FOR 5 DAYS BEFORE ISSUING A FINAL NEGATIVE REPORT     Performed at [facility]   Report Status PENDING   Incomplete  CULTURE, BLOOD (ROUTINE X 2)     Status: None   Collection Time    03/09/13  5:45 PM      Result Value Ref Range Status   Specimen Description BLOOD WRIST RIGHT   Final   Special Requests BOTTLES DRAWN AEROBIC AND ANAEROBIC 5CC   Final   Culture  Setup Time     Final   Value: 03/10/2013 00:13     Performed at [facility]   Culture     Final   Value: STREPTOCOCCUS  PNEUMONIAE     Note: Gram Stain Report Called to,Read Back By and Verified With: [name]_0 :17PM ON 03/10/13 BY DANTS     Performed at [facility]   Report Status 03/12/2013 FINAL   Final   Organism ID, Bacteria STREPTOCOCCUS PNEUMONIAE   Final  URINE CULTURE     Status: None   Collection Time    03/09/13  6:44 PM      Result Value Ref Range Status   Specimen Description URINE, CATHETERIZED   Final   Special Requests NONE   Final   Culture  Setup Time     Final   Value: 03/10/2013 01:12     Performed at [place]     Final   Value: NO GROWTH     Performed at [facility]   Culture     Final   Value: NO GROWTH     Performed at [facility]   Report Status 03/10/2013 FINAL   Final  MRSA PCR SCREENING     Status: None   Collection Time    03/09/13  9:53 PM      Result Value Ref Range Status   MRSA by PCR NEGATIVE  NEGATIVE Final   Comment:            The GeneXpert MRSA Assay (FDA     approved for NASAL specimens     only), is one component of a     comprehensive MRSA colonization     surveillance program. It is not     intended to diagnose MRSA     infection nor to guide or     monitor treatment for     MRSA infections.  RESPIRATORY VIRUS PANEL     Status: None   Collection Time    03/10/13 11:18 AM      Result Value Ref Range Status   Source - RVPAN NASAL SWAB   Corrected   Comment: CORRECTED ON 03/08 AT 0143: PREVIOUSLY REPORTED AS NASAL SWAB   Respiratory Syncytial Virus A NOT DETECTED   Final   Respiratory Syncytial Virus B  NOT DETECTED   Final   Influenza A NOT DETECTED   Final   Influenza B NOT DETECTED   Final   Parainfluenza 1 NOT DETECTED   Final   Parainfluenza 2 NOT DETECTED   Final   Parainfluenza 3 NOT DETECTED   Final   Metapneumovirus NOT DETECTED   Final   Rhinovirus NOT DETECTED   Final   Adenovirus NOT DETECTED   Final   Influenza A H1 NOT DETECTED   Final   Influenza A H3 NOT DETECTED   Final    Comment: (NOTE)           Normal Reference Range for each Analyte: NOT DETECTED     Testing performed using the Luminex xTAG Respiratory Viral Panel test     kit.     This test was developed and its performance characteristics determined     by [facility]. It has not been cleared or approved by the [place]     Food and Drug Administration. This test is used for clinical purposes.     It should not be regarded as investigational or for research. This     laboratory is certified under the [place] (CLIA) as qualified to perform high complexity     clinical laboratory testing.     Performed at [place], BLOOD (ROUTINE X 2)     Status: None   Collection Time    03/12/13  9:18 AM      Result Value Ref Range Status   Specimen Description BLOOD LEFT ARM   Final   Special Requests BOTTLES DRAWN AEROBIC ONLY 3CC   Final   Culture  Setup Time     Final   Value: 03/12/2013 18:59     Performed at [facility]   Culture     Final   Value:        BLOOD CULTURE RECEIVED NO GROWTH TO DATE CULTURE WILL BE HELD FOR 5 DAYS BEFORE ISSUING A FINAL NEGATIVE REPORT     Performed at [facility]   Report Status PENDING   Incomplete  CULTURE, BLOOD (ROUTINE X 2)     Status: None   Collection Time    03/12/13  9:23 AM      Result Value Ref Range Status   Specimen Description BLOOD LEFT HAND   Final   Special Requests BOTTLES DRAWN AEROBIC ONLY 3CC   Final   Culture  Setup Time     Final   Value: 03/12/2013 18:58     Performed at [facility]   Culture     Final   Value:        BLOOD CULTURE RECEIVED NO GROWTH TO DATE CULTURE WILL BE HELD FOR 5 DAYS BEFORE ISSUING A FINAL NEGATIVE REPORT     Performed at [facility]   Report Status PENDING   Incomplete     Scheduled Meds: . aspirin  325 mg Oral Daily  . budesonide (PULMICORT) nebulizer solution  0.25 mg Nebulization BID  . cefTRIAXone (ROCEPHIN)   IV  2 g Intravenous Q24H  . donepezil  10 mg Oral QHS  . feeding supplement (RESOURCE BREEZE)  1 Container Oral TID BM  . heparin  5,000 Units Subcutaneous 3 times per day  . memantine  10 mg Oral BID  . multivitamin-lutein  1 capsule Oral Daily  . simvastatin  40 mg Oral QHS  Continuous Infusions: . sodium chloride 20 mL/hr at 03/11/13 2153     Kelvin Cellar, DO  [facility] Pager [phone]  If 7PM-7AM, please contact night-coverage [email or web address] Password [email or web address] 03/15/2013, 4:46 PM   LOS: 6 days

## 2013-03-15 NOTE — Clinical Social Work Note (Signed)
[facility] and once [facility] is confirmed, CSW will initiate [employer] pre authorization.  Genelle BalVanessa Jakavion Bilodeau, MSW, LCSW [phone]

## 2013-03-16 DIAGNOSIS — I38 Endocarditis, valve unspecified: Secondary | ICD-10-CM

## 2013-03-16 DIAGNOSIS — Z954 Presence of other heart-valve replacement: Secondary | ICD-10-CM

## 2013-03-16 LAB — CULTURE, BLOOD (ROUTINE X 2): Culture: NO GROWTH

## 2013-03-16 MED ORDER — DOCUSATE SODIUM 50 MG/5ML PO LIQD: 50.0000 mg | Freq: Two times a day (BID) | ORAL | Status: DC

## 2013-03-16 MED ORDER — DOCUSATE SODIUM 50 MG/5ML PO LIQD
50.0000 mg | Freq: Two times a day (BID) | ORAL | Status: DC
  Administered 2013-03-16: 50 mg via ORAL
  Filled 2013-03-16 (×2): qty 10

## 2013-03-16 MED ORDER — FLEET ENEMA 7-19 GM/118ML RE ENEM
1.0000 | ENEMA | Freq: Once | RECTAL | Status: AC
  Administered 2013-03-16: 1 via RECTAL
  Filled 2013-03-16: qty 1

## 2013-03-16 MED ORDER — LACTULOSE 10 GM/15ML PO SOLN: 20.0000 g | Freq: Every day | ORAL | Status: DC | PRN

## 2013-03-16 MED ORDER — DEXTROSE 5 % IV SOLN
2.0000 g | INTRAVENOUS | Status: DC
  Filled 2013-03-16: qty 2

## 2013-03-16 MED ORDER — LACTULOSE 10 GM/15ML PO SOLN
20.0000 g | Freq: Every day | ORAL | Status: DC | PRN
  Administered 2013-03-16: 20 g via ORAL
  Filled 2013-03-16: qty 30

## 2013-03-16 NOTE — Discharge Summary (Signed)
Hence Derrick Court DZH:[id number] DOB: 1921-04-02 DOA: 03/09/2013  PCP: Laurey Morale, MD  Admit date: 03/09/2013  Discharge date: 03/14/2013  Recommendations for Outpatient Follow-up:  1. Pt will need to follow up with PCP in 2 weeks post discharge 2. Please obtain BMP to evaluate electrolytes and kidney function 3. Please also check CBC to evaluate Hg and Hct levels 4. Continue ceftriaxone 2 g IV daily through 03/26/2013. 5. Remove PICC line after last dose of ceftriaxone.  Discharge Diagnoses:  Sepsis  -Present at the time of admission  -Secondary to pneumonia and bacteremia  -Improved  -Leukocytosis improved, afebrile, hemodynamically stable  Bacteremia  -Streptococcus pneumoniae  -Surveillance blood cultures-neg  -TEE on 03/14/13--negative for any vegetations  -History of bioprosthetic aortic valve  -Discontinue vancomycin and aztreonam-  -continue ceftriaxone--2 g daily  -PICC line will be placed since surveillance blood cultures are negative  -The patient will continue on ceftriaxone, 2 g IV daily through 03/26/2013 which is 14 days beyond the first negative culture  CAP  -Positive urine Streptococcus pneumoniae antigen as well as bacteremia  -Ceftriaxone as discussed--patient tolerating without difficulty  -discontinue oseltamivir--respiratory virus panel negative  Acute toxic encephalopathy  -Secondary to sepsis  -Gradually improving with antibiotics  Acute kidney injury  -Continue IV fluids  -Gradually improving  -Baseline creatinine 0.8-1.1  Diabetes mellitus type 2, controlled  -12/26/2012 hemoglobin A1c 7.2  -Discontinue CBGs, NovoLog sliding scale  -The patient will be discharged back on Januvia, lower dose  -will also restart glyburide  -He will not be restarted on metformin as his creatinine clearance is approximately 40  Dysphagia  -Continue dysphagia 3 diet with nectar thickened liquids per speech therapy recommendations  Dementia  -Continue Aricept and Namenda   Family Communication: Updated daughter and wife at bedside  Disposition Plan:SNF  Antibiotics:  Vancomycin and aztreonam 03/10/2013>>>> 03/12/2013  ceftriaxone 03/12/2013>>>03/28/13 Discharge Condition: Stable  Disposition: skilled nursing facility  Diet: Dysphagia 3 diet with nectar thickened liquids  Wt Readings from Last 3 Encounters:   03/09/13  60.7 kg (133 lb 13.1 oz)   03/09/13  60.7 kg (133 lb 13.1 oz)   12/26/12  56.7 kg (125 lb)    History of present illness:  80 year old male presents from home with altered mental status and increasing lethargy. There is also a history of worsening generalized weakness. The patient was unable to provide any history due to his encephalopathy. The patient was noted to have a fever of 101.70F with hypoxemia at the time of admission with oxygen saturation 85% on room air. Chest x-ray revealed right lower lobe infiltrate. The patient was started on vancomycin and aztreonam empirically. Blood cultures were sent and grew Streptococcus pneumoniae. His antibiotics were tailored to ceftriaxone. He tolerated it well. The patient's mentation gradually improved. After discussion with the patient's wife, he remained full code. The patient was noted to have acute on chronic renal failure. Initial creatinine was 1.41. This did improve back to his usual baseline after fluid resuscitation. The patient was evaluated by speech therapy due to concerns for aspiration. They recommended a dysphagia 3 diet with nectar thickened liquids. Surveillance blood cultures were obtained and remained negative. Given the history of a prosthetic heart valve, transesophageal echocardiogram was performed. It was negative for any vegetations. After surveillance blood cultures remained negative, a PICC line was placed. The patient will continue on ceftriaxone for 14 days beyond the first negative culture. The anticipated stop date of antibiotics is 03/26/2013. The patient was started on NovoLog  sliding  scale initially. His CBGs were controlled. Hemoglobin A1c was noted to be 7.2 on 12/26/2012.    Discharge summary addendum I evaluated patient on 03/16/2013. Patient stable, no acute distress, tolerating by mouth intake. He has now been afebrile for greater than 72 hours, hemodynamically stable, tolerating PO intake. Will plan to continue ceftriaxone 2 g IV every 24 hours with a stop date on 03/28/2013. Family members updated. Patient discharged in stable condition to his skilled nursing facility on 03/16/2013.  Discharge Exam:  Filed Vitals:    03/14/13 1629   BP:  163/79   Pulse:  75   Temp:  98.4 F (36.9 C)   Resp:  20    Filed Vitals:    03/14/13 1538  03/14/13 1539  03/14/13 1548  03/14/13 1629   BP:  143/51    163/79   Pulse:  85    75   Temp:  98 F (36.7 C)    98.4 F (36.9 C)   TempSrc:       Resp:  17    20   Height:       Weight:       SpO2:  90%  93%  96%  94%    General: A&O x 2, NAD, pleasant, cooperative  Cardiovascular: RRR, no rub, no gallop, no S3  Respiratory: Bilateral scattered rhonchi, right greater than left. No wheezing.  Abdomen:soft, nontender, nondistended, positive bowel sounds  Extremities: 1+LE edema, No lymphangitis, no petechiae  Discharge Instructions     Medication List     STOP taking these medications       lisinopril 20 MG tablet    Commonly known as: PRINIVIL,ZESTRIL    metFORMIN 1000 MG tablet    Commonly known as: GLUCOPHAGE     TAKE these medications       ACCU-CHEK AVIVA PLUS W/DEVICE Kit    Test once per day and diagnosis code is 250.00    accu-chek soft touch lancets    Test once per day    aspirin 325 MG tablet    Take 325 mg by mouth daily.    dextrose 5 % SOLN 50 mL with cefTRIAXone 2 G SOLR 2 g    Inject 2 g into the vein daily. Last dose to be given on 03/26/13    donepezil 10 MG tablet    Commonly known as: ARICEPT    Take 1 tablet (10 mg total) by mouth at bedtime as needed.    feeding supplement  (RESOURCE BREEZE) Liqd    Take 1 Container by mouth 3 (three) times daily between meals.    food thickener Powd    Commonly known as: THICK IT    Use with thin liquids to thicken as needed    glucose blood test strip    Commonly known as: ACCU-CHEK AVIVA    Test once per day    glyBURIDE 5 MG tablet    Commonly known as: DIABETA    Take 1 tablet (5 mg total) by mouth 2 (two) times daily with a meal.    meclizine 25 MG tablet    Commonly known as: ANTIVERT    Take 1 tablet (25 mg total) by mouth as needed for dizziness. FOR DIZZINESS    memantine 10 MG tablet    Commonly known as: NAMENDA    Take 1 tablet (10 mg total) by mouth 2 (two) times daily.    multivitamin-lutein Caps capsule    Take 1 capsule by mouth  daily.    silver sulfADIAZINE 1 % cream    Commonly known as: SILVADENE    Apply topically 2 (two) times daily.    simvastatin 40 MG tablet    Commonly known as: ZOCOR    Take 1 tablet (40 mg total) by mouth at bedtime.    sitaGLIPtin 50 MG tablet    Commonly known as: JANUVIA    Take 1 tablet (50 mg total) by mouth daily.       The results of significant diagnostics from this hospitalization (including imaging, microbiology, ancillary and laboratory) are listed below for reference.   Significant Diagnostic Studies:  Dg Chest Port 1 View  03/09/2013 CLINICAL DATA: Cough and congestion. Chills. Altered mental status. EXAM: PORTABLE CHEST - 1 VIEW COMPARISON: Two-view chest x-ray 01/03/2011. FINDINGS: Heart size is normal. Atherosclerotic calcifications are again noted. Fused perihilar bronchitic changes are again seen. Superimposed ill-defined airspace disease is present at the lung bases, right greater than left. The visualized soft tissues and bony thorax are unremarkable. IMPRESSION: 1. Acute on chronic perihilar bronchitic changes bilaterally. 2. Superimposed she right lower lobe airspace disease is concerning for early pneumonia. 3. Atherosclerosis. Electronically Signed By:  Lawrence Santiago M.D. On: 03/09/2013 18:14  Dg Swallowing Func-speech Pathology  03/13/2013 [name], CCC-SLP 03/13/2013 1:02 PM Objective Swallowing Evaluation: Modified Barium Swallowing Study Patient Details Name: Revis Whalin Harden MRN: [id number] Date of Birth: 1921/12/10 Today's Date: 03/13/2013 Time: 1015-1045 SLP Time Calculation (min): 30 min Past Medical History: Past Medical History Diagnosis Date . Flashers or floaters, right eye . Hyperlipidemia . DJD of shoulder . Aortic stenosis severe sees Dr. Peter Martinique . Mitral insufficiency . RBBB (right bundle branch block) . LAFB (left anterior fascicular block) . Shingles . Diabetes mellitus . Carotid artery disease last dopplers on 3/53/29 were stable, LICA with 9-24% stenosis and RICA with 40-59% . Acute arterial ischemic stroke, vertebrobasilar, thalamic 01/08 . Pneumonia 04/11 . Dementia Past Surgical History: Past Surgical History Procedure Laterality Date . Tonsillectomy . Colonoscopy 11/04 clear no repeats needed . Aortic valve replacement used bovine valve in 2000 per Dr. Cyndia Bent . Carotid endarterectomy 01/13/08 left per Dr. Kellie Simmering HPI: Arshdeep Bolger Heidenreich is a 80 y.o. male who presented to the ED with AMS. Family then noted after he woke up he was "more confused" than normal. By the time he got to the ED he was noted to be hypoxic, febrile, tachycardic, and had developed cough. CXR shows superimposed right LL airspace disease. Assessment / Plan / Recommendation Clinical Impression Dysphagia Diagnosis: Mild oral phase dysphagia;Mild pharyngeal phase dysphagia Clinical impression: Patient presents with a mild sensory-motor based oropharyngeal dysphagia characterized by a delayed swallow initiation resulting in penetration/aspiration of thin liquids. SLP attempted use of straw to facilitate chin tuck posture which did not improve airway protection. Recommend continuation of current diet with aspiration precautions to decrease risk. Suspect some degree of chronic  dysphagia as cause of acute PNA however may be exacerbated by respiratory decline. Will f/u at bedside for diet tolerance and to encourage use of strategies. Education complete with patient and family. Pending improvements make while in the hospital, patient may benefit from [facility] SLP f/u to monitor for further improvements and determine if repeat MBS may be beneficial to evaluate for potential to advance diet. Treatment Recommendation Therapy as outlined in treatment plan below Diet Recommendation Dysphagia 3 (Mechanical Soft);Nectar-thick liquid Liquid Administration via: Cup;No straw Medication Administration: Whole meds with puree Supervision: Patient able to self feed;Full supervision/cueing for  compensatory strategies Compensations: Slow rate;Small sips/bites Postural Changes and/or Swallow Maneuvers: Seated upright 90 degrees Other Recommendations Recommended Consults: MBS (prior to advancing diet) Oral Care Recommendations: Oral care BID Other Recommendations: Order thickener from pharmacy;Prohibited food (jello, ice cream, thin soups);Remove water pitcher Follow Up Recommendations Home health SLP Frequency and Duration min 2x/week 2 weeks General HPI: Eden Rho Miske is a 80 y.o. male who presented to the ED with AMS. Family then noted after he woke up he was "more confused" than normal. By the time he got to the ED he was noted to be hypoxic, febrile, tachycardic, and had developed cough. CXR shows superimposed right LL airspace disease. Type of Study: Modified Barium Swallowing Study Reason for Referral: Objectively evaluate swallowing function Previous Swallow Assessment: none in chart Diet Prior to this Study: Dysphagia 3 (soft);Nectar-thick liquids Temperature Spikes Noted: No Respiratory Status: Room air History of Recent Intubation: No Behavior/Cognition: Alert;Cooperative;Pleasant mood;Confused;Requires cueing Oral Cavity - Dentition: (top partial) Oral Motor / Sensory Function: Impaired - see Bedside  swallow eval Self-Feeding Abilities: Able to feed self Patient Positioning: Upright in chair Baseline Vocal Quality: Clear Volitional Cough: Congested Volitional Swallow: Able to elicit Anatomy: Other (Comment) (? small osteophytes along lower C-spine, MD not present) Pharyngeal Secretions: Not observed secondary MBS Reason for Referral Objectively evaluate swallowing function Oral Phase Oral Preparation/Oral Phase Oral Phase: Impaired Oral - Nectar Oral - Nectar Cup: Delayed oral transit Oral - Thin Oral - Thin Cup: Delayed oral transit Oral - Thin Straw: Delayed oral transit Oral - Solids Oral - Mechanical Soft: Delayed oral transit Oral - Pill: Delayed oral transit Pharyngeal Phase Pharyngeal Phase Pharyngeal Phase: Impaired Pharyngeal - Nectar Pharyngeal - Nectar Cup: Delayed swallow initiation;Premature spillage to valleculae;Pharyngeal residue - valleculae;Reduced tongue base retraction Pharyngeal - Thin Pharyngeal - Thin Cup: Delayed swallow initiation;Premature spillage to pyriform sinuses;Reduced tongue base retraction;Reduced airway/laryngeal closure;Penetration/Aspiration before swallow Penetration/Aspiration details (thin cup): Material enters airway, passes BELOW cords and not ejected out despite cough attempt by patient Pharyngeal - Thin Straw: Delayed swallow initiation;Premature spillage to pyriform sinuses;Reduced tongue base retraction;Reduced airway/laryngeal closure;Penetration/Aspiration before swallow (attempted chin tuck, unsuccessful) Penetration/Aspiration details (thin straw): Material enters airway, passes BELOW cords and not ejected out despite cough attempt by patient Pharyngeal - Solids Pharyngeal - Puree: Delayed swallow initiation;Premature spillage to valleculae;Pharyngeal residue - valleculae;Reduced tongue base retraction Pharyngeal - Mechanical Soft: Delayed swallow initiation;Premature spillage to valleculae;Reduced tongue base retraction;Pharyngeal residue - valleculae  Pharyngeal - Pill: Within functional limits (provided whole in puree, patient masticated) Cervical Esophageal Phase GO Gabriel Rainwater MA, CCC-SLP [phone] Cervical Esophageal Phase Cervical Esophageal Phase: [facility] McCoy Leah Meryl 03/13/2013, 1:01 PM  Microbiology:  Recent Results (from the past 240 hour(s))   CULTURE, BLOOD (ROUTINE X 2) Status: None    Collection Time    03/09/13 5:42 PM   Result  Value  Ref Range  Status    Specimen Description  BLOOD LEFT ANTECUBITAL   Final    Special Requests  BOTTLES DRAWN AEROBIC AND ANAEROBIC 5CC   Final    Culture Setup Time    Final    Value:  03/10/2013 00:14     Performed at [facility]    Culture    Final    Value:  BLOOD CULTURE RECEIVED NO GROWTH TO DATE CULTURE WILL BE HELD FOR 5 DAYS BEFORE ISSUING A FINAL NEGATIVE REPORT     Performed at [facility]    Report Status  PENDING   Incomplete  CULTURE, BLOOD (ROUTINE X 2) Status: None    Collection Time    03/09/13 5:45 PM   Result  Value  Ref Range  Status    Specimen Description  BLOOD WRIST RIGHT   Final    Special Requests  BOTTLES DRAWN AEROBIC AND ANAEROBIC 5CC   Final    Culture Setup Time    Final    Value:  03/10/2013 00:13     Performed at [facility]    Culture    Final    Value:  STREPTOCOCCUS PNEUMONIAE     Note: Gram Stain Report Called to,Read Back By and Verified With: [name]@1 :17PM ON 03/10/13 BY DANTS     Performed at [facility]    Report Status  03/12/2013 FINAL   Final    Organism ID, Bacteria  STREPTOCOCCUS PNEUMONIAE   Final   URINE CULTURE Status: None    Collection Time    03/09/13 6:44 PM   Result  Value  Ref Range  Status    Specimen Description  URINE, CATHETERIZED   Final    Special Requests  NONE   Final    Culture Setup Time    Final    Value:  03/10/2013 01:12     Performed at [facility]    Final    Value:  NO GROWTH     Performed at [facility]    Culture    Final     Value:  NO GROWTH     Performed at [facility]    Report Status  03/10/2013 FINAL   Final   MRSA PCR SCREENING Status: None    Collection Time    03/09/13 9:53 PM   Result  Value  Ref Range  Status    MRSA by PCR  NEGATIVE  NEGATIVE  Final    Comment:      The GeneXpert MRSA Assay (FDA     approved for NASAL specimens     only), is one component of a     comprehensive MRSA colonization     surveillance program. It is not     intended to diagnose MRSA     infection nor to guide or     monitor treatment for     MRSA infections.   RESPIRATORY VIRUS PANEL Status: None    Collection Time    03/10/13 11:18 AM   Result  Value  Ref Range  Status    Source - RVPAN  NASAL SWAB   Corrected    Comment:  CORRECTED ON 03/08 AT 0143: PREVIOUSLY REPORTED AS NASAL SWAB    Respiratory Syncytial Virus A  NOT DETECTED   Final    Respiratory Syncytial Virus B  NOT DETECTED   Final    Influenza A  NOT DETECTED   Final    Influenza B  NOT DETECTED   Final    Parainfluenza 1  NOT DETECTED   Final    Parainfluenza 2  NOT DETECTED   Final    Parainfluenza 3  NOT DETECTED   Final    Metapneumovirus  NOT DETECTED   Final    Rhinovirus  NOT DETECTED   Final    Adenovirus  NOT DETECTED   Final    Influenza A H1  NOT DETECTED   Final    Influenza A H3  NOT DETECTED   Final    Comment:  (NOTE)  Normal Reference Range for each Analyte: NOT DETECTED     Testing performed using the Luminex xTAG Respiratory Viral Panel test     kit.     This test was developed and its performance characteristics determined     by [facility]. It has not been cleared or approved by the [place]     Food and Drug Administration. This test is used for clinical purposes.     It should not be regarded as investigational or for research. This     laboratory is certified under the [place] (CLIA) as qualified to perform high complexity     clinical laboratory testing.      Performed at [facility], BLOOD (ROUTINE X 2) Status: None    Collection Time    03/12/13 9:18 AM   Result  Value  Ref Range  Status    Specimen Description  BLOOD LEFT ARM   Final    Special Requests  BOTTLES DRAWN AEROBIC ONLY 3CC   Final    Culture Setup Time    Final    Value:  03/12/2013 18:59     Performed at [facility]    Culture    Final    Value:  BLOOD CULTURE RECEIVED NO GROWTH TO DATE CULTURE WILL BE HELD FOR 5 DAYS BEFORE ISSUING A FINAL NEGATIVE REPORT     Performed at [facility]    Report Status  PENDING   Incomplete   CULTURE, BLOOD (ROUTINE X 2) Status: None    Collection Time    03/12/13 9:23 AM   Result  Value  Ref Range  Status    Specimen Description  BLOOD LEFT HAND   Final    Special Requests  BOTTLES DRAWN AEROBIC ONLY 3CC   Final    Culture Setup Time    Final    Value:  03/12/2013 18:58     Performed at [facility]    Culture    Final    Value:  BLOOD CULTURE RECEIVED NO GROWTH TO DATE CULTURE WILL BE HELD FOR 5 DAYS BEFORE ISSUING A FINAL NEGATIVE REPORT     Performed at [facility]    Report Status  PENDING   Incomplete    Labs:  Basic Metabolic Panel:   Recent Labs  Lab  03/10/13 0020  03/10/13 0258  03/12/13 0551  03/13/13 0652  03/14/13 0534   NA  142  144  141  143  142   K  4.9  4.9  3.6*  3.7  3.8   CL  107  111  108  109  107   CO2  21  20  21  22  25    GLUCOSE  213*  205*  106*  157*  180*   BUN  44*  43*  20  17  15    CREATININE  1.28  1.31  1.05  0.95  0.96   CALCIUM  8.9  8.7  8.3*  8.2*  8.3*    Liver Function Tests:   Recent Labs  Lab  03/09/13 1738   AST  17   ALT  11   ALKPHOS  117   BILITOT  0.3   PROT  7.6   ALBUMIN  3.9    No results found for this basename: LIPASE, AMYLASE, in the last 168 hours  No results found for this basename:  AMMONIA, in the last 168 hours  CBC:   Recent Labs  Lab  03/09/13 1738  03/10/13 0258  03/12/13 0551   03/13/13 0652  03/14/13 0534   WBC  11.1*  13.8*  8.1  6.5  6.4   NEUTROABS  10.0*  --  --  --  --   HGB  13.0  9.5*  9.3*  9.1*  9.4*   HCT  39.4  29.0*  28.1*  27.2*  27.8*   MCV  92.1  90.3  89.8  88.0  88.0   PLT  175  137*  130*  139*  154    Cardiac Enzymes:  No results found for this basename: CKTOTAL, CKMB, CKMBINDEX, TROPONINI, in the last 168 hours  BNP:  No components found with this basename: POCBNP,  CBG:   Recent Labs  Lab  03/13/13 1600  03/13/13 2022  03/14/13 0727  03/14/13 1155  03/14/13 1517   GLUCAP  170*  354*  179*  152*  151*    Time coordinating discharge: Greater than 30 minutes  Signed:

## 2013-03-16 NOTE — Progress Notes (Signed)
Small to moderate amount light brown colored bowel movement post fleet enema given.

## 2013-03-16 NOTE — Clinical Social Work Psychosocial (Signed)
Clinical Social Work Department BRIEF PSYCHOSOCIAL ASSESSMENT 03/16/2013  Patient:  Christian [name], Christian [name]     Account Number:  [id number]     Admit date:  03/09/2013  Clinical Social Worker:  Frederico Hamman  Date/Time:  03/16/2013 07:19 AM  Referred by:    Date Referred:  03/15/2013 Referred for  SNF Placement   Other Referral:   Interview type:  Family Other interview type:   On 03/15/13 CSW talked with patient's wife Investment banker, operational and later with daughter Christian [name]) at the room.    PSYCHOSOCIAL DATA Living Status:  WIFE Admitted from facility:   Level of care:   Primary support name:  Christian [name] Primary support relationship to patient:  SPOUSE Degree of support available:   Wife is very caring and concerned regarding patient's health and his care. Patient also has 2 children, a son and daughter Christian [name]) that are concerned and involved.    CURRENT CONCERNS Current Concerns  Post-Acute Placement   Other Concerns:    SOCIAL WORK ASSESSMENT / PLAN On 03/15/13, CSW talked initially with patient's wife and patient regarding discharge plans and recommendation of ST rehab. Christian [name] was in a agreement with ST rehab and gave her preferences, that included [facility], [facility] and [facility]. She also provided CSW with 3 facilities that she did not want. CSW explained facility search process and answered her questions.    Later same date, CSW met and talked with patient's daughter Christian [name] who lives in [place]. She was aware that ST rehab was being pursued and was in agreement. She and Christian [name] decided that [facility] the first choice. After our conversation daughter was taking Christian [name] home to get some rest.   Assessment/plan status:  No Further Intervention Required Other assessment/ plan:   Information/referral to community resources:   Skilled facility list for [facility] provided.    PATIENT'S/FAMILY'S RESPONSE TO PLAN OF CARE: Wife very open and  receptive to talking with CSW as was daughter, who requested to speak with CSW upon her arrival to hospital. Patient was awake but did not participate in conversation.

## 2013-03-16 NOTE — Clinical Social Work Placement (Signed)
Clinical Social Work Department CLINICAL SOCIAL WORK PLACEMENT NOTE 03/16/2013  Patient:  Christian PartCLYMER,Christian [name]  Account Number:  [id number] Admit date:  03/09/2013  Clinical Social Worker:  Genelle BalVANESSA Robyne Matar, LCSW  Date/time:  03/16/2013 07:28 AM  Clinical Social Work is seeking post-discharge placement for this patient at the following level of care:   SKILLED NURSING   (*CSW will update this form in Epic as items are completed)   03/15/2013  Patient/family provided with [facility] Department of Clinical Social Work's list of facilities offering this level of care within the geographic area requested by the patient (or if unable, by the patient's family).  03/15/2013  Patient/family informed of their freedom to choose among providers that offer the needed level of care, that participate in Medicare, Medicaid or managed care program needed by the patient, have an available bed and are willing to accept the patient.    Patient/family informed of [facility]' ownership interest in [facility], as well as of the fact that they are under no obligation to receive care at this facility.  PASARR submitted to EDS on 03/15/13 PASARR number received from EDS on 03/15/13  FL2 transmitted to all facilities in geographic area requested by pt/family on  03/15/2013 FL2 transmitted to all facilities within larger geographic area on   Patient informed that his/her managed care company has contracts with or will negotiate with  certain facilities, including the following:     Patient/family informed of bed offers received:  03/16/2013 [facility] Physician recommends and patient chooses bed at    [facility]  03/16/2013 Patient to be transferred to facility by ambulance  The following physician request were entered in Epic:  Additional Comments: 03/16/13[facility] and  provided ambulance authorization - #[id number].

## 2013-03-18 LAB — CULTURE, BLOOD (ROUTINE X 2)
Culture: NO GROWTH
Culture: NO GROWTH

## 2013-04-03 ENCOUNTER — Telehealth: Payer: Self-pay | Admitting: Family Medicine

## 2013-04-03 NOTE — Telephone Encounter (Signed)
Okay per Dr. Fry to schedule. 

## 2013-04-03 NOTE — Telephone Encounter (Signed)
Pt's wife has post hosp fup on wed at 3:15. Pt also needs post hos fup and would like to know if he can come in at the appt time directly after hers. It is a same day. They were in the hospital at the same time. Pt had pnuemonia. pls advise if ok to sche

## 2013-04-03 NOTE — Telephone Encounter (Signed)
done

## 2013-04-05 ENCOUNTER — Ambulatory Visit (INDEPENDENT_AMBULATORY_CARE_PROVIDER_SITE_OTHER): Payer: Medicare Other | Admitting: Family Medicine

## 2013-04-05 ENCOUNTER — Encounter: Payer: Self-pay | Admitting: Family Medicine

## 2013-04-05 VITALS — BP 142/64 | HR 82 | Temp 97.3°F

## 2013-04-05 DIAGNOSIS — J189 Pneumonia, unspecified organism: Secondary | ICD-10-CM

## 2013-04-05 DIAGNOSIS — F0390 Unspecified dementia without behavioral disturbance: Secondary | ICD-10-CM

## 2013-04-05 DIAGNOSIS — R531 Weakness: Secondary | ICD-10-CM

## 2013-04-05 DIAGNOSIS — R5381 Other malaise: Secondary | ICD-10-CM

## 2013-04-05 DIAGNOSIS — E119 Type 2 diabetes mellitus without complications: Secondary | ICD-10-CM

## 2013-04-05 DIAGNOSIS — I10 Essential (primary) hypertension: Secondary | ICD-10-CM

## 2013-04-05 DIAGNOSIS — R5383 Other fatigue: Secondary | ICD-10-CM

## 2013-04-05 DIAGNOSIS — I6350 Cerebral infarction due to unspecified occlusion or stenosis of unspecified cerebral artery: Secondary | ICD-10-CM

## 2013-04-05 NOTE — Progress Notes (Signed)
   Subjective:    Patient ID: Christian [name], male    DOB: Aug 29, 1921, 80 y.o.   MRN: [id number]  HPI Here to follow up a hospital stay from 03-09-13 to 03-14-13 for aspiration pneumonia. His swallowing was evaluated and he is now on a nectar thickened fluids diet. He was treated with antibiotics and responded well. He had some renal insufficiency so this was watched closely. He was taken off Metformin and started on insulin. He is currently getting Novolog four times a day according to a sliding scale. His am fasting glucoses are excellent in the range of 89 to 120. He has had a few spikes after dinner up to 289, but this was after eating a high calorie dessert like banana pudding. His appetite has improved. [facility], and he has been back home since 03-31-13.    Review of Systems  Respiratory: Negative.   Cardiovascular: Negative.   Neurological: Positive for weakness.       Objective:   Physical Exam  Constitutional:  He has lost a lot of weight, he is frail but alert   Cardiovascular: Normal rate, regular rhythm, normal heart sounds and intact distal pulses.   Pulmonary/Chest: Effort normal and breath sounds normal. No respiratory distress. He has no wheezes. He has no rales.          Assessment & Plan:  He seems to be doing well after a bout of pneumonia. Get a CBC and a BMET today. I think it is dangerous to give him rapid acting insulin right before bedtime so she will stop all bedtime dosing. Continue with SS Novolog at meal times. Avoid concentrated sweets in the evenings.

## 2013-04-05 NOTE — Progress Notes (Signed)
Pre visit review using our clinic review tool, if applicable. No additional management support is needed unless otherwise documented below in the visit note. 

## 2013-04-06 LAB — CBC WITH DIFFERENTIAL/PLATELET
BASOS PCT: 0.7 % (ref 0.0–3.0)
Basophils Absolute: 0 10*3/uL (ref 0.0–0.1)
EOS ABS: 0.1 10*3/uL (ref 0.0–0.7)
EOS PCT: 0.9 % (ref 0.0–5.0)
HCT: 35.2 % — ABNORMAL LOW (ref 39.0–52.0)
HEMOGLOBIN: 11.6 g/dL — AB (ref 13.0–17.0)
LYMPHS PCT: 27.8 % (ref 12.0–46.0)
Lymphs Abs: 1.7 10*3/uL (ref 0.7–4.0)
MCHC: 33 g/dL (ref 30.0–36.0)
MCV: 90.6 fl (ref 78.0–100.0)
Monocytes Absolute: 0.4 10*3/uL (ref 0.1–1.0)
Monocytes Relative: 6.7 % (ref 3.0–12.0)
NEUTROS ABS: 4 10*3/uL (ref 1.4–7.7)
Neutrophils Relative %: 63.9 % (ref 43.0–77.0)
Platelets: 183 10*3/uL (ref 150.0–400.0)
RBC: 3.88 Mil/uL — AB (ref 4.22–5.81)
RDW: 15.9 % — ABNORMAL HIGH (ref 11.5–14.6)
WBC: 6.2 10*3/uL (ref 4.5–10.5)

## 2013-04-06 LAB — BASIC METABOLIC PANEL
BUN: 26 mg/dL — ABNORMAL HIGH (ref 6–23)
CHLORIDE: 100 meq/L (ref 96–112)
CO2: 26 mEq/L (ref 19–32)
Calcium: 9.4 mg/dL (ref 8.4–10.5)
Creatinine, Ser: 1.3 mg/dL (ref 0.4–1.5)
GFR: 53.75 mL/min — ABNORMAL LOW (ref >60.00)
Glucose, Bld: 116 mg/dL — ABNORMAL HIGH (ref 70–99)
POTASSIUM: 4.7 meq/L (ref 3.5–5.1)
Sodium: 137 mEq/L (ref 135–145)

## 2013-04-26 ENCOUNTER — Telehealth: Payer: Self-pay | Admitting: Family Medicine

## 2013-04-26 NOTE — Telephone Encounter (Signed)
Called and spoke to the pharmacy.  Pt has refills on file and he picked up a prescription today.

## 2013-04-26 NOTE — Telephone Encounter (Signed)
[id number] [place], [place] - [address] requesting refill of memantine (NAMENDA) 10 MG tablet

## 2013-04-28 ENCOUNTER — Telehealth: Payer: Self-pay

## 2013-04-28 NOTE — Telephone Encounter (Signed)
Relevant patient education mailed to patient.  

## 2013-05-02 ENCOUNTER — Other Ambulatory Visit: Payer: Self-pay | Admitting: Family Medicine

## 2013-05-11 ENCOUNTER — Telehealth: Payer: Self-pay | Admitting: Family Medicine

## 2013-05-11 NOTE — Telephone Encounter (Signed)
I agree with her. He should go back on Januvia 50 mg daily and Glyburide 5 mg bid, and stop the insulin. Have him see me in a few months and we will check another A1c

## 2013-05-11 NOTE — Telephone Encounter (Signed)
Pt was in hosp 3/5. They put pt on insulin aspart (NOVOLOG) 100 UNIT/ML injection. Dr fry did not subscribe this med. Dr fry has him sitaGLIPtin (JANUVIA) 50 MG tablet  glyBURIDE (DIABETA) 5 MG tablet   Pt sent home w/ sliding scale for novolog and wife states he has been taking only 2 units one time per week.wife states they don't  see having to stick him for this small amount one time a week. Wife states dr fry was doing a great job w/ meds he was on. pls advise.  Pt has lost weight also which has helped w/ his DM. pt is eating really well

## 2013-05-11 NOTE — Telephone Encounter (Signed)
I spoke with pt's wife and went over the below information. 

## 2013-05-30 ENCOUNTER — Other Ambulatory Visit (HOSPITAL_COMMUNITY): Payer: Self-pay | Admitting: Cardiology

## 2013-05-30 DIAGNOSIS — I6529 Occlusion and stenosis of unspecified carotid artery: Secondary | ICD-10-CM

## 2013-06-05 ENCOUNTER — Ambulatory Visit (HOSPITAL_COMMUNITY): Payer: Medicare Other | Attending: Cardiovascular Disease | Admitting: Cardiology

## 2013-06-05 DIAGNOSIS — I6529 Occlusion and stenosis of unspecified carotid artery: Secondary | ICD-10-CM | POA: Insufficient documentation

## 2013-06-05 NOTE — Progress Notes (Signed)
Carotid duplex complete 

## 2013-06-06 ENCOUNTER — Telehealth: Payer: Self-pay | Admitting: Family Medicine

## 2013-06-06 NOTE — Telephone Encounter (Signed)
Pt [facility] pharm [facility] [address]. Pt call in aviva test strips

## 2013-06-07 MED ORDER — GLUCOSE BLOOD VI STRP: ORAL_STRIP | Status: AC

## 2013-06-07 NOTE — Telephone Encounter (Signed)
I sent script e-scribe. 

## 2013-06-21 ENCOUNTER — Telehealth: Payer: Self-pay | Admitting: Cardiology

## 2013-06-21 NOTE — Telephone Encounter (Signed)
New Prob    Requesting pt withhold 325 mg Aspirin 2 days prior and 2 days following extraction of 2 teeth. Please call.

## 2013-06-21 NOTE — Telephone Encounter (Signed)
Called the dentist's office . Unable to leave a message.

## 2013-06-22 NOTE — Telephone Encounter (Signed)
OK to hold ASA for 2 days for dental work.  Peter SwazilandJordan MD, [facility]

## 2013-06-22 NOTE — Telephone Encounter (Signed)
Called Sheree at the dental office and she is aware that patient can hold ASA per Dr.Jordan. Note faxed to her at [phone].

## 2013-06-26 ENCOUNTER — Telehealth: Payer: Self-pay | Admitting: Cardiology

## 2013-06-26 NOTE — Telephone Encounter (Signed)
Received request from Nurse fax box, documents faxed for surgical clearance. To: [facility] Fax number: [phone] Attention: 6.22.15/km

## 2013-10-05 ENCOUNTER — Ambulatory Visit (INDEPENDENT_AMBULATORY_CARE_PROVIDER_SITE_OTHER): Payer: Medicare Other | Admitting: Cardiology

## 2013-10-05 ENCOUNTER — Encounter: Payer: Self-pay | Admitting: Cardiology

## 2013-10-05 VITALS — BP 155/62 | HR 82 | Ht 72.0 in | Wt 119.0 lb

## 2013-10-05 DIAGNOSIS — I6350 Cerebral infarction due to unspecified occlusion or stenosis of unspecified cerebral artery: Secondary | ICD-10-CM

## 2013-10-05 DIAGNOSIS — Z954 Presence of other heart-valve replacement: Secondary | ICD-10-CM

## 2013-10-05 DIAGNOSIS — I10 Essential (primary) hypertension: Secondary | ICD-10-CM

## 2013-10-05 DIAGNOSIS — I38 Endocarditis, valve unspecified: Secondary | ICD-10-CM

## 2013-10-05 DIAGNOSIS — I639 Cerebral infarction, unspecified: Secondary | ICD-10-CM

## 2013-10-05 DIAGNOSIS — Z952 Presence of prosthetic heart valve: Secondary | ICD-10-CM

## 2013-10-05 NOTE — Progress Notes (Signed)
Christian [name] Date of Birth: [birth year] Medical Record #[id number]  History of Present Illness: Christian [name] is seen for followup cardiac evaluation today. He has a history of severe aortic stenosis and is status post bioprosthetic aortic valve replacement in 1999.   He also has carotid arterial disease and is status post left carotid endarterectomy in 2010.  He has progressive dementia. He was admitted in March of this year with Strep pneumo PNA and bacteremia. He had a TEE that showed no vegetations. AV prosthesis was functioning normally. EF was normal. Since then he has lost 25 lbs. He reports his appetite is OK but he continues to lose weight. No edema, chest pain, or SOB.  Current Outpatient Prescriptions on File Prior to Visit  Medication Sig Dispense Refill  . aspirin 325 MG tablet Take 325 mg by mouth daily.        . Blood Glucose Monitoring Suppl (ACCU-CHEK AVIVA PLUS) W/DEVICE KIT Test once per day and diagnosis code is 250.00  1 kit  0  . dextrose 5 % SOLN 50 mL with cefTRIAXone 2 G SOLR 2 g Inject 2 g into the vein daily. Last dose to be given on 03/26/13  12 vial  0  . donepezil (ARICEPT) 10 MG tablet Take 10 mg by mouth daily.      . feeding supplement, RESOURCE BREEZE, (RESOURCE BREEZE) LIQD Take 1 Container by mouth 3 (three) times daily between meals.    0  . food thickener (THICK IT) POWD Use with thin liquids to thicken as needed  284 g  0  . glucose blood (ACCU-CHEK AVIVA) test strip Test once per day  100 each  1  . glyBURIDE (DIABETA) 5 MG tablet Take 1 tablet (5 mg total) by mouth 2 (two) times daily with a meal.  60 tablet  11  . Lancets (ACCU-CHEK SOFT TOUCH) lancets Test once per day  100 each  1  . meclizine (ANTIVERT) 25 MG tablet Take 1 tablet (25 mg total) by mouth as needed for dizziness. FOR DIZZINESS  60 tablet  11  . memantine (NAMENDA) 10 MG tablet Take 1 tablet (10 mg total) by mouth 2 (two) times daily.  60 tablet  11  . multivitamin-lutein (OCUVITE-LUTEIN)  CAPS Take 1 capsule by mouth daily.        . simvastatin (ZOCOR) 40 MG tablet Take 1 tablet (40 mg total) by mouth at bedtime.  30 tablet  11  . sitaGLIPtin (JANUVIA) 50 MG tablet Take 1 tablet (50 mg total) by mouth daily.  30 tablet  11   No current facility-administered medications on file prior to visit.    Allergies  Allergen Reactions  . Penicillins Rash    Past Medical History  Diagnosis Date  . Flashers or floaters, right eye   . Hyperlipidemia   . DJD of shoulder   . Aortic stenosis     severe sees Dr. Peter Martinique  . Mitral insufficiency   . RBBB (right bundle branch block)   . LAFB (left anterior fascicular block)   . Shingles   . Diabetes mellitus   . Carotid artery disease     last dopplers on 08/13/96 were stable, LICA with 3-38% stenosis and RICA with 40-59%  . Acute arterial ischemic stroke, vertebrobasilar, thalamic 01/08  . Pneumonia 04/11  . Dementia     Past Surgical History  Procedure Laterality Date  . Tonsillectomy    . Colonoscopy  11/04    clear no  repeats needed  . Aortic valve replacement      used bovine valve in 2000 per Dr. Cyndia Bent  . Carotid endarterectomy  01/13/08    left per Dr. Kellie Simmering  . Tee without cardioversion N/A 03/14/2013    Procedure: TRANSESOPHAGEAL ECHOCARDIOGRAM (TEE);  Surgeon: Lelon Perla, MD;  Location: [facility];  Service: Cardiovascular;  Laterality: N/A;    History  Smoking status  . Former Smoker  Smokeless tobacco  . Never Used    History  Alcohol Use No    History reviewed. No pertinent family history.  Review of Systems: As noted in history of present illness. All other systems were reviewed and are negative.  Physical Exam: BP 155/62  Pulse 82  Ht 6' (1.829 m)  Wt 119 lb (53.978 kg)  BMI 16.14 kg/m2 He is a thin elderly white male in no acute distress. The HEENT exam is normal.  The carotids are 2+ with bilateral bruits.  There is no thyromegaly.  There is no JVD.  The lungs are clear.    The  heart exam reveals a regular rate with a normal S1 and S2.  There is a soft grade 1/6 systolic murmur the right upper sternal border..  The PMI is not displaced.   Abdominal exam reveals good bowel sounds.   There is no hepatosplenomegaly or tenderness.  There are no masses.  Exam of the legs reveal no clubbing, cyanosis, or edema.  The legs are without rashes.  The distal pulses are intact.  Cranial nerves II - XII are intact.    LABORATORY DATA:   Assessment / Plan: 1. Aortic stenosis status post bioprosthetic aortic valve replacement in 1999. Exam is stable. TEE in March showed good valve function. I will followup again in one year. 2. Carotid arterial disease status post carotid endarterectomy in 2010. Moderate disease by doppler in June. Given his advanced dementia and age I have recommended no longer checking routine Dopplers. I don't think we will intervene unless he was having significant symptoms. 3. Advanced dementia 4. Diabetes mellitus type 2 5. Dyslipidemia-on Zocor. 6. S/p PNA with bacteremia. 7. Weight loss.

## 2013-10-05 NOTE — Patient Instructions (Signed)
Continue your current therapy  I will see you in one year   

## 2013-11-27 ENCOUNTER — Other Ambulatory Visit (HOSPITAL_COMMUNITY): Payer: Self-pay | Admitting: Cardiology

## 2013-11-27 DIAGNOSIS — I6523 Occlusion and stenosis of bilateral carotid arteries: Secondary | ICD-10-CM

## 2013-12-07 ENCOUNTER — Ambulatory Visit (HOSPITAL_COMMUNITY): Payer: Medicare Other | Attending: Cardiology | Admitting: Cardiology

## 2013-12-07 DIAGNOSIS — Z8673 Personal history of transient ischemic attack (TIA), and cerebral infarction without residual deficits: Secondary | ICD-10-CM | POA: Diagnosis not present

## 2013-12-07 DIAGNOSIS — E785 Hyperlipidemia, unspecified: Secondary | ICD-10-CM | POA: Diagnosis not present

## 2013-12-07 DIAGNOSIS — Z951 Presence of aortocoronary bypass graft: Secondary | ICD-10-CM | POA: Insufficient documentation

## 2013-12-07 DIAGNOSIS — I6523 Occlusion and stenosis of bilateral carotid arteries: Secondary | ICD-10-CM

## 2013-12-07 DIAGNOSIS — I10 Essential (primary) hypertension: Secondary | ICD-10-CM | POA: Insufficient documentation

## 2013-12-07 DIAGNOSIS — Z87891 Personal history of nicotine dependence: Secondary | ICD-10-CM | POA: Diagnosis not present

## 2013-12-07 DIAGNOSIS — E119 Type 2 diabetes mellitus without complications: Secondary | ICD-10-CM | POA: Insufficient documentation

## 2013-12-07 NOTE — Progress Notes (Signed)
Carotid duplex performed 

## 2013-12-25 ENCOUNTER — Telehealth: Payer: Self-pay

## 2013-12-25 NOTE — Telephone Encounter (Signed)
Rx request for Simvastatin 40 mg tablet- Take 1 tablet by mouth a bedtime.

## 2013-12-25 NOTE — Telephone Encounter (Signed)
Rx request for:   Januvia 100 mg tablet- Take 1 tablet by mouth daily #30  Memantine HCL 10 mg tablet- Take 1 tablet by mouth twice daily #60  [facility] [address]  Pls advise.

## 2013-12-26 ENCOUNTER — Other Ambulatory Visit: Payer: Self-pay | Admitting: *Deleted

## 2013-12-26 MED ORDER — DONEPEZIL HCL 10 MG PO TABS: 10.0000 mg | ORAL_TABLET | Freq: Every day | ORAL | Status: DC

## 2013-12-26 MED ORDER — MEMANTINE HCL 10 MG PO TABS: 10.0000 mg | ORAL_TABLET | Freq: Two times a day (BID) | ORAL | Status: DC

## 2013-12-26 MED ORDER — SITAGLIPTIN PHOSPHATE 50 MG PO TABS: 50.0000 mg | ORAL_TABLET | Freq: Every day | ORAL | Status: DC

## 2013-12-26 NOTE — Addendum Note (Signed)
Addended by: Alfred LevinsWYRICK, CINDY D on: 12/26/2013 04:16 PM   Modules accepted: Orders

## 2013-12-27 ENCOUNTER — Other Ambulatory Visit: Payer: Self-pay | Admitting: *Deleted

## 2013-12-27 MED ORDER — SIMVASTATIN 40 MG PO TABS: 40.0000 mg | ORAL_TABLET | Freq: Every day | ORAL | Status: DC

## 2013-12-27 NOTE — Telephone Encounter (Signed)
Scripts were sent e-scribe.  

## 2014-01-01 ENCOUNTER — Encounter: Payer: Medicare Other | Admitting: Family Medicine

## 2014-01-01 ENCOUNTER — Other Ambulatory Visit: Payer: Self-pay | Admitting: *Deleted

## 2014-01-01 MED ORDER — GLYBURIDE 5 MG PO TABS: 5.0000 mg | ORAL_TABLET | Freq: Two times a day (BID) | ORAL | Status: DC

## 2014-01-02 ENCOUNTER — Encounter: Payer: Self-pay | Admitting: Family Medicine

## 2014-01-02 ENCOUNTER — Ambulatory Visit (INDEPENDENT_AMBULATORY_CARE_PROVIDER_SITE_OTHER): Payer: Medicare Other | Admitting: Family Medicine

## 2014-01-02 VITALS — BP 144/59 | HR 81 | Temp 97.6°F | Ht 72.0 in | Wt 120.0 lb

## 2014-01-02 DIAGNOSIS — Z0000 Encounter for general adult medical examination without abnormal findings: Secondary | ICD-10-CM

## 2014-01-02 DIAGNOSIS — Z23 Encounter for immunization: Secondary | ICD-10-CM

## 2014-01-02 DIAGNOSIS — E119 Type 2 diabetes mellitus without complications: Secondary | ICD-10-CM

## 2014-01-02 LAB — CBC WITH DIFFERENTIAL/PLATELET
Basophils Absolute: 0 10*3/uL (ref 0.0–0.1)
Basophils Relative: 0.3 % (ref 0.0–3.0)
Eosinophils Absolute: 0.1 10*3/uL (ref 0.0–0.7)
Eosinophils Relative: 0.6 % (ref 0.0–5.0)
HCT: 37.5 % — ABNORMAL LOW (ref 39.0–52.0)
HEMOGLOBIN: 12.1 g/dL — AB (ref 13.0–17.0)
LYMPHS ABS: 2.6 10*3/uL (ref 0.7–4.0)
Lymphocytes Relative: 30.2 % (ref 12.0–46.0)
MCHC: 32.4 g/dL (ref 30.0–36.0)
MCV: 89.3 fl (ref 78.0–100.0)
MONOS PCT: 5.8 % (ref 3.0–12.0)
Monocytes Absolute: 0.5 10*3/uL (ref 0.1–1.0)
NEUTROS ABS: 5.4 10*3/uL (ref 1.4–7.7)
Neutrophils Relative %: 63.1 % (ref 43.0–77.0)
Platelets: 157 10*3/uL (ref 150.0–400.0)
RBC: 4.2 Mil/uL — AB (ref 4.22–5.81)
RDW: 15.5 % (ref 11.5–15.5)
WBC: 8.5 10*3/uL (ref 4.0–10.5)

## 2014-01-02 LAB — BASIC METABOLIC PANEL
BUN: 44 mg/dL — AB (ref 6–23)
CO2: 25 mEq/L (ref 19–32)
CREATININE: 1.3 mg/dL (ref 0.4–1.5)
Calcium: 9.4 mg/dL (ref 8.4–10.5)
Chloride: 111 mEq/L (ref 96–112)
GFR: 55.58 mL/min — ABNORMAL LOW (ref >60.00)
Glucose, Bld: 149 mg/dL — ABNORMAL HIGH (ref 70–99)
POTASSIUM: 5.9 meq/L — AB (ref 3.5–5.1)
Sodium: 143 mEq/L (ref 135–145)

## 2014-01-02 LAB — LIPID PANEL
CHOLESTEROL: 116 mg/dL (ref 0–200)
HDL: 50.5 mg/dL (ref >39.00)
LDL CALC: 42 mg/dL (ref 0–99)
NonHDL: 65.5
Total CHOL/HDL Ratio: 2
Triglycerides: 117 mg/dL (ref 0.0–149.0)
VLDL: 23.4 mg/dL (ref 0.0–40.0)

## 2014-01-02 LAB — MICROALBUMIN / CREATININE URINE RATIO
CREATININE, U: 111 mg/dL
Microalb Creat Ratio: 1.5 mg/g (ref 0.0–30.0)
Microalb, Ur: 1.7 mg/dL (ref 0.0–1.9)

## 2014-01-02 LAB — HEPATIC FUNCTION PANEL
ALT: 24 U/L (ref 0–53)
AST: 23 U/L (ref 0–37)
Albumin: 4.2 g/dL (ref 3.5–5.2)
Alkaline Phosphatase: 88 U/L (ref 39–117)
BILIRUBIN DIRECT: 0.1 mg/dL (ref 0.0–0.3)
Total Bilirubin: 0.4 mg/dL (ref 0.2–1.2)
Total Protein: 7.3 g/dL (ref 6.0–8.3)

## 2014-01-02 LAB — POCT URINALYSIS DIPSTICK
Bilirubin, UA: NEGATIVE
Glucose, UA: NEGATIVE
Ketones, UA: NEGATIVE
Leukocytes, UA: NEGATIVE
Nitrite, UA: NEGATIVE
RBC UA: NEGATIVE
SPEC GRAV UA: 1.025
UROBILINOGEN UA: 0.2
pH, UA: 5

## 2014-01-02 LAB — TSH: TSH: 0.17 u[IU]/mL — ABNORMAL LOW (ref 0.35–4.50)

## 2014-01-02 LAB — HEMOGLOBIN A1C: HEMOGLOBIN A1C: 7.8 % — AB (ref 4.6–6.5)

## 2014-01-02 MED ORDER — SILVER SULFADIAZINE 1 % EX CREA: 1.0000 "application " | TOPICAL_CREAM | Freq: Every day | CUTANEOUS | Status: DC

## 2014-01-02 NOTE — Progress Notes (Signed)
Pre visit review using our clinic review tool, if applicable. No additional management support is needed unless otherwise documented below in the visit note. 

## 2014-01-02 NOTE — Progress Notes (Signed)
   Subjective:    Patient ID: Christian [name], male    DOB: 1921-09-28, 80 y.o.   MRN: [id number]  HPI 80 yr old male here with his wife for a cpx. He has been doing fairly well according to his wife. He has advanced dementia so she provides the history. His appetite is good and he has had no new problems. He continues on Aricept and Namenda and his wife wants to stay on these. His memory has declined a bit more lately but his moods are stable. He sleeps well. He saw Dr. SwazilandJordan in October and his cardiac status is stable.    Review of Systems  HENT: Negative.   Eyes: Negative.   Respiratory: Negative.   Cardiovascular: Negative.   Gastrointestinal: Negative.   Genitourinary: Negative.   Musculoskeletal: Negative.   Skin: Negative.   Neurological: Positive for weakness. Negative for dizziness, tremors, seizures, syncope, facial asymmetry, speech difficulty, light-headedness, numbness and headaches.  Psychiatric/Behavioral: Negative.        Objective:   Physical Exam  Constitutional: He is oriented to person, place, and time. He appears well-developed and well-nourished. No distress.  In a wheelchair but he can walk with assistance   HENT:  Head: Normocephalic and atraumatic.  Right Ear: External ear normal.  Left Ear: External ear normal.  Nose: Nose normal.  Mouth/Throat: Oropharynx is clear and moist. No oropharyngeal exudate.  Eyes: Conjunctivae and EOM are normal. Pupils are equal, round, and reactive to light. Right eye exhibits no discharge. Left eye exhibits no discharge. No scleral icterus.  Neck: Neck supple. No JVD present. No tracheal deviation present. No thyromegaly present.  Cardiovascular: Normal rate, regular rhythm, normal heart sounds and intact distal pulses.  Exam reveals no gallop and no friction rub.   No murmur heard. Pulmonary/Chest: Effort normal and breath sounds normal. No respiratory distress. He has no wheezes. He has no rales. He exhibits no tenderness.    Abdominal: Soft. Bowel sounds are normal. He exhibits no distension and no mass. There is no tenderness. There is no rebound and no guarding.  Genitourinary: Penis normal. No penile tenderness.  Musculoskeletal: Normal range of motion. He exhibits no edema or tenderness.  Lymphadenopathy:    He has no cervical adenopathy.  Neurological: He is alert and oriented to person, place, and time. He has normal reflexes. No cranial nerve deficit. He exhibits normal muscle tone. Coordination normal.  Skin: Skin is warm and dry. No rash noted. He is not diaphoretic. No erythema. No pallor.  Psychiatric: He has a normal mood and affect. His behavior is normal. Judgment and thought content normal.          Assessment & Plan:  Well exam. Get fasting labs

## 2014-01-16 ENCOUNTER — Other Ambulatory Visit: Payer: Self-pay | Admitting: Family Medicine

## 2014-01-16 NOTE — Telephone Encounter (Signed)
Refill request for Lisinopril 20 mg take 1 po qd ( [facility] )

## 2014-01-17 ENCOUNTER — Other Ambulatory Visit: Payer: Self-pay | Admitting: Family Medicine

## 2014-01-18 NOTE — Telephone Encounter (Signed)
I sent script e-scribe. 

## 2014-02-05 ENCOUNTER — Telehealth: Payer: Self-pay | Admitting: Family Medicine

## 2014-02-05 NOTE — Telephone Encounter (Signed)
Pt 's insurance will no longer cover glyBURIDE (DIABETA) 5 MG tablet.  Insurance suggest glimepiride  r glipizide   Also, wife states sitaGLIPtin (JANUVIA) 50 MG tablet  Is giving pt diarrhea and a runny nose. At the point pt needs to change to something else,. [facility] [address]

## 2014-02-06 MED ORDER — GLIPIZIDE 10 MG PO TABS: 10.0000 mg | ORAL_TABLET | Freq: Two times a day (BID) | ORAL | Status: DC

## 2014-02-06 NOTE — Telephone Encounter (Signed)
I spoke with pt's wife, [facility] and updated medication list.

## 2014-02-06 NOTE — Telephone Encounter (Signed)
Stop both of these meds and switch to Glipizide 10 mg bid. Call in #60 with 11 rf

## 2014-03-08 ENCOUNTER — Telehealth: Payer: Self-pay | Admitting: Family Medicine

## 2014-03-08 NOTE — Telephone Encounter (Signed)
Wife states pt's blood sugars have been running 169, 162, 154 in the am and in the evening . Pt's meds were changed in feb (glipiZIDE (GLUCOTROL) 10 MG tablet) and wife would like to know if these reading sound all right.  Wife thinks too high. pls advise  [facility]

## 2014-03-09 MED ORDER — SITAGLIPTIN PHOSPHATE 100 MG PO TABS: 100.0000 mg | ORAL_TABLET | Freq: Every day | ORAL | Status: DC

## 2014-03-09 NOTE — Telephone Encounter (Signed)
[facility] and spoke with pt's son and went over below information.

## 2014-03-09 NOTE — Telephone Encounter (Signed)
I agree these are too high. Stay on the Glipizide but add Januvia 100 mg daily. Call in one year supply

## 2014-03-20 ENCOUNTER — Telehealth: Payer: Self-pay | Admitting: Family Medicine

## 2014-03-20 NOTE — Telephone Encounter (Signed)
Wife called she wanted Dr Clent RidgesFry to see her and her husband on 03/21/14 because her daughter was going to come and bring them to the office. I wanted to let you know that I used a SDA slot and is that ok

## 2014-03-20 NOTE — Telephone Encounter (Signed)
Okay to schedule per Dr. Clent RidgesFry. Pt's are on our schedule.

## 2014-03-21 ENCOUNTER — Encounter: Payer: Self-pay | Admitting: Family Medicine

## 2014-03-21 ENCOUNTER — Ambulatory Visit (INDEPENDENT_AMBULATORY_CARE_PROVIDER_SITE_OTHER): Payer: Medicare Other | Admitting: Family Medicine

## 2014-03-21 VITALS — BP 118/50 | HR 84 | Temp 98.1°F

## 2014-03-21 DIAGNOSIS — F0390 Unspecified dementia without behavioral disturbance: Secondary | ICD-10-CM

## 2014-03-21 DIAGNOSIS — E119 Type 2 diabetes mellitus without complications: Secondary | ICD-10-CM

## 2014-03-21 DIAGNOSIS — I10 Essential (primary) hypertension: Secondary | ICD-10-CM

## 2014-03-21 NOTE — Progress Notes (Signed)
   Subjective:    Patient ID: Christian [name], male    DOB: 05-30-1921, 81 y.o.   MRN: [id number]  HPI Here with questions about his diabetes. Over the past month his glucoses have ranged from a low of 87 to a high of 220, with a lot of values in the upper 100s. He seems to be feeling well. His last A1c was in late December so it is a little too early to get one today. He is tolerating the meds well. Appetite is good.    Review of Systems  Constitutional: Negative.   Respiratory: Negative.   Cardiovascular: Negative.        Objective:   Physical Exam  Constitutional: He appears well-developed and well-nourished.  Cardiovascular: Normal rate, regular rhythm, normal heart sounds and intact distal pulses.   Pulmonary/Chest: Effort normal and breath sounds normal.  Neurological: He is alert.          Assessment & Plan:  His diabetes seems to be fairly stable. His wife expresses frustration about how she cannot get him to eat his meals on a regular schedule, but I told her she is doing as well as could be expected.

## 2014-03-21 NOTE — Progress Notes (Signed)
Pre visit review using our clinic review tool, if applicable. No additional management support is needed unless otherwise documented below in the visit note. 

## 2014-03-22 ENCOUNTER — Telehealth: Payer: Self-pay | Admitting: Family Medicine

## 2014-03-22 NOTE — Telephone Encounter (Signed)
emmi emailed °

## 2014-05-07 ENCOUNTER — Other Ambulatory Visit: Payer: Self-pay | Admitting: Family Medicine

## 2014-05-07 NOTE — Telephone Encounter (Signed)
Can we refill these? 

## 2014-05-08 NOTE — Telephone Encounter (Signed)
Refill all three meds for one year

## 2014-07-02 ENCOUNTER — Other Ambulatory Visit: Payer: Self-pay

## 2014-08-25 ENCOUNTER — Other Ambulatory Visit: Payer: Self-pay | Admitting: Family Medicine

## 2014-09-13 ENCOUNTER — Telehealth: Payer: Self-pay

## 2014-09-13 NOTE — Telephone Encounter (Signed)
The pt's wife called and is hoping to get a larger bottle of the cream the pt is using on his bottom.  She states she is needing to use this cream twice per day.  [facility] [address]   Pt's wife callback - [phone]

## 2014-09-13 NOTE — Telephone Encounter (Signed)
Call in Silvadene 1000 gram jar with 5 rf

## 2014-09-17 MED ORDER — SILVER SULFADIAZINE 1 % EX CREA: TOPICAL_CREAM | CUTANEOUS | Status: AC

## 2014-09-17 NOTE — Telephone Encounter (Signed)
I sent script e-scribe and left a message for pt. 

## 2014-10-11 ENCOUNTER — Ambulatory Visit: Payer: Medicare Other | Admitting: Cardiology

## 2014-10-17 ENCOUNTER — Encounter: Payer: Self-pay | Admitting: Cardiology

## 2014-10-17 ENCOUNTER — Ambulatory Visit (INDEPENDENT_AMBULATORY_CARE_PROVIDER_SITE_OTHER): Payer: Medicare Other | Admitting: Cardiology

## 2014-10-17 VITALS — BP 130/58 | HR 83 | Ht 72.0 in | Wt 128.4 lb

## 2014-10-17 DIAGNOSIS — Z954 Presence of other heart-valve replacement: Secondary | ICD-10-CM | POA: Diagnosis not present

## 2014-10-17 DIAGNOSIS — I779 Disorder of arteries and arterioles, unspecified: Secondary | ICD-10-CM | POA: Diagnosis not present

## 2014-10-17 DIAGNOSIS — Z952 Presence of prosthetic heart valve: Secondary | ICD-10-CM

## 2014-10-17 DIAGNOSIS — I739 Peripheral vascular disease, unspecified: Secondary | ICD-10-CM

## 2014-10-17 DIAGNOSIS — I10 Essential (primary) hypertension: Secondary | ICD-10-CM

## 2014-10-17 DIAGNOSIS — E119 Type 2 diabetes mellitus without complications: Secondary | ICD-10-CM

## 2014-10-17 NOTE — Patient Instructions (Signed)
Continue your current therapy  I will see you in one year   

## 2014-10-17 NOTE — Progress Notes (Signed)
Christian [name] Date of Birth: 1921-08-25 Medical Record #[id number]  History of Present Illness: Christian [name] is seen for followup AVR. He is seen with his wife and daughter. He has a history of severe aortic stenosis and is status post bioprosthetic aortic valve replacement in 1999.   He also has carotid arterial disease and is status post left carotid endarterectomy in 2010.  He has progressive dementia. He was admitted in March 2015 with Strep pneumo PNA and bacteremia. He had a TEE that showed no vegetations. AV prosthesis was functioning normally. EF was normal. Since then he has done very well. No chest pain or SOB. No TIA or CVA symptoms. He is eating well but still using Thick-it for aspiration precautions. No new medical problems.   Current Outpatient Prescriptions on File Prior to Visit  Medication Sig Dispense Refill  . aspirin 325 MG tablet Take 325 mg by mouth daily.      . Blood Glucose Monitoring Suppl (ACCU-CHEK AVIVA PLUS) W/DEVICE KIT Test once per day and diagnosis code is 250.00 1 kit 0  . donepezil (ARICEPT) 10 MG tablet TAKE 1 TABLET BY MOUTH EVERY DAY 30 tablet 11  . food thickener (THICK IT) POWD Use with thin liquids to thicken as needed 284 g 0  . glipiZIDE (GLUCOTROL) 10 MG tablet Take 1 tablet (10 mg total) by mouth 2 (two) times daily before a meal. 60 tablet 11  . glucose blood (ACCU-CHEK AVIVA) test strip Test once per day 100 each 1  . Lancets (ACCU-CHEK SOFT TOUCH) lancets Test once per day 100 each 1  . lisinopril (PRINIVIL,ZESTRIL) 20 MG tablet Take 20 mg by mouth daily.     . meclizine (ANTIVERT) 25 MG tablet Take 1 tablet (25 mg total) by mouth as needed for dizziness. FOR DIZZINESS 60 tablet 11  . memantine (NAMENDA) 10 MG tablet TAKE 1 TABLET BY MOUTH TWICE A DAY 60 tablet 11  . multivitamin-lutein (OCUVITE-LUTEIN) CAPS Take 1 capsule by mouth daily.      . silver sulfADIAZINE (SILVADENE) 1 % cream APPLY 1 APPLICATION TOPICALLY DAILY. (Patient taking  differently: Apply 1 application topically 2 (two) times daily. APPLY 1 APPLICATION TOPICALLY DAILY.) 1000 g 5  . simvastatin (ZOCOR) 40 MG tablet TAKE 1 TABLET (40 MG TOTAL) BY MOUTH AT BEDTIME. 30 tablet 11  . sitaGLIPtin (JANUVIA) 100 MG tablet Take 1 tablet (100 mg total) by mouth daily. 30 tablet 11   No current facility-administered medications on file prior to visit.    Allergies  Allergen Reactions  . Penicillins Rash    Past Medical History  Diagnosis Date  . Flashers or floaters, right eye   . Hyperlipidemia   . DJD of shoulder   . Aortic stenosis     severe sees Dr. Rubyann Lingle [place]  . Mitral insufficiency   . RBBB (right bundle branch block)   . LAFB (left anterior fascicular block)   . Shingles   . Diabetes mellitus   . Carotid artery disease ([place])     last dopplers on 06/27/27 were stable, LICA with 7-98% stenosis and RICA with 40-59%  . Acute arterial ischemic stroke, vertebrobasilar, thalamic ([place]) 01/08  . Pneumonia 04/11  . Dementia     Past Surgical History  Procedure Laterality Date  . Tonsillectomy    . Colonoscopy  11/04    clear no repeats needed  . Aortic valve replacement      used bovine valve in 2000 per Dr. Cyndia Bent  .  Carotid endarterectomy  01/13/08    left per Dr. Kellie Simmering  . Tee without cardioversion N/A 03/14/2013    Procedure: TRANSESOPHAGEAL ECHOCARDIOGRAM (TEE);  Surgeon: Lelon Perla, MD;  Location: [facility];  Service: Cardiovascular;  Laterality: N/A;    History  Smoking status  . Former Smoker  Smokeless tobacco  . Never Used    History  Alcohol Use No    History reviewed. No pertinent family history.  Review of Systems: As noted in history of present illness. All other systems were reviewed and are negative.  Physical Exam: BP 130/58 mmHg  Pulse 83  Ht 6' (1.829 m)  Wt 58.242 kg (128 lb 6.4 oz)  BMI 17.41 kg/m2 He is a thin elderly white male in no acute distress. He is seen in a wheelchair.  The HEENT exam is  normal.  The carotids are 2+ with bilateral bruits.  There is no thyromegaly.  There is no JVD.  The lungs are clear.    The heart exam reveals a regular rate with a normal S1 and S2.  There is a soft grade 1/6 systolic murmur the right upper sternal border..  The PMI is not displaced.   Abdominal exam reveals good bowel sounds.    There are no masses.  Exam of the legs reveal no clubbing, cyanosis, or edema.  The legs are without rashes.  The distal pulses are intact.  Cranial nerves II - XII are intact.    LABORATORY DATA: Ecg today shows NSR with a chronic RBBB. I have personally reviewed and interpreted this study.  Lab Results  Component Value Date   WBC 8.5 01/02/2014   HGB 12.1* 01/02/2014   HCT 37.5* 01/02/2014   PLT 157.0 01/02/2014   GLUCOSE 149* 01/02/2014   CHOL 116 01/02/2014   TRIG 117.0 01/02/2014   HDL 50.50 01/02/2014   LDLCALC 42 01/02/2014   ALT 24 01/02/2014   AST 23 01/02/2014   NA 143 01/02/2014   K 5.9* 01/02/2014   CL 111 01/02/2014   CREATININE 1.3 01/02/2014   BUN 44* 01/02/2014   CO2 25 01/02/2014   TSH 0.17* 01/02/2014   PSA 0.39 12/26/2012   INR 1.03 01/03/2011   HGBA1C 7.8* 01/02/2014   MICROALBUR 1.7 01/02/2014    Assessment / Plan: 1. Aortic stenosis status post bioprosthetic aortic valve replacement in 1999. Exam is stable. TEE in March 2015 showed good valve function. 2. Carotid arterial disease status post carotid endarterectomy in 2010. Moderate disease by doppler in December 2015. . Given his advanced dementia and age I have recommended no longer checking routine Dopplers. I don't think we will intervene unless he was having significant symptoms. 3. Advanced dementia 4. Diabetes mellitus type 2 5. Dyslipidemia-on Zocor. 6. S/p PNA with bacteremia.

## 2014-12-09 ENCOUNTER — Other Ambulatory Visit: Payer: Self-pay | Admitting: Family Medicine

## 2014-12-10 NOTE — Telephone Encounter (Signed)
Can we refill this? 

## 2014-12-24 ENCOUNTER — Other Ambulatory Visit: Payer: Self-pay | Admitting: Family Medicine

## 2015-01-08 ENCOUNTER — Encounter: Payer: Medicare Other | Admitting: Family Medicine

## 2015-01-10 ENCOUNTER — Encounter: Payer: Self-pay | Admitting: Family Medicine

## 2015-01-10 ENCOUNTER — Ambulatory Visit (INDEPENDENT_AMBULATORY_CARE_PROVIDER_SITE_OTHER): Payer: Medicare Other | Admitting: Family Medicine

## 2015-01-10 VITALS — BP 121/74 | HR 85 | Wt 126.0 lb

## 2015-01-10 DIAGNOSIS — E119 Type 2 diabetes mellitus without complications: Secondary | ICD-10-CM | POA: Diagnosis not present

## 2015-01-10 DIAGNOSIS — Z23 Encounter for immunization: Secondary | ICD-10-CM

## 2015-01-10 DIAGNOSIS — E785 Hyperlipidemia, unspecified: Secondary | ICD-10-CM | POA: Diagnosis not present

## 2015-01-10 DIAGNOSIS — Z0000 Encounter for general adult medical examination without abnormal findings: Secondary | ICD-10-CM | POA: Diagnosis not present

## 2015-01-10 LAB — HEPATIC FUNCTION PANEL
ALBUMIN: 4.1 g/dL (ref 3.5–5.2)
ALK PHOS: 111 U/L (ref 39–117)
ALT: 15 U/L (ref 0–53)
AST: 19 U/L (ref 0–37)
BILIRUBIN DIRECT: 0.1 mg/dL (ref 0.0–0.3)
TOTAL PROTEIN: 6.9 g/dL (ref 6.0–8.3)
Total Bilirubin: 0.4 mg/dL (ref 0.2–1.2)

## 2015-01-10 LAB — CBC WITH DIFFERENTIAL/PLATELET
BASOS PCT: 0.3 % (ref 0.0–3.0)
Basophils Absolute: 0 10*3/uL (ref 0.0–0.1)
EOS ABS: 0 10*3/uL (ref 0.0–0.7)
EOS PCT: 0.7 % (ref 0.0–5.0)
HCT: 38.4 % — ABNORMAL LOW (ref 39.0–52.0)
HEMOGLOBIN: 12.4 g/dL — AB (ref 13.0–17.0)
LYMPHS ABS: 1.8 10*3/uL (ref 0.7–4.0)
Lymphocytes Relative: 26.5 % (ref 12.0–46.0)
MCHC: 32.2 g/dL (ref 30.0–36.0)
MCV: 87.3 fl (ref 78.0–100.0)
MONO ABS: 0.4 10*3/uL (ref 0.1–1.0)
Monocytes Relative: 6.7 % (ref 3.0–12.0)
NEUTROS ABS: 4.4 10*3/uL (ref 1.4–7.7)
Neutrophils Relative %: 65.8 % (ref 43.0–77.0)
PLATELETS: 167 10*3/uL (ref 150.0–400.0)
RBC: 4.4 Mil/uL (ref 4.22–5.81)
RDW: 16.3 % — AB (ref 11.5–15.5)
WBC: 6.7 10*3/uL (ref 4.0–10.5)

## 2015-01-10 LAB — BASIC METABOLIC PANEL
BUN: 34 mg/dL — ABNORMAL HIGH (ref 6–23)
CALCIUM: 9.4 mg/dL (ref 8.4–10.5)
CHLORIDE: 105 meq/L (ref 96–112)
CO2: 28 meq/L (ref 19–32)
Creatinine, Ser: 1.26 mg/dL (ref 0.40–1.50)
GFR: 56.98 mL/min — ABNORMAL LOW (ref >60.00)
GLUCOSE: 157 mg/dL — AB (ref 70–99)
Potassium: 5.6 mEq/L — ABNORMAL HIGH (ref 3.5–5.1)
SODIUM: 140 meq/L (ref 135–145)

## 2015-01-10 LAB — LIPID PANEL
CHOLESTEROL: 112 mg/dL (ref 0–200)
HDL: 49.2 mg/dL (ref >39.00)
LDL Cholesterol: 37 mg/dL (ref 0–99)
NONHDL: 62.37
Total CHOL/HDL Ratio: 2
Triglycerides: 129 mg/dL (ref 0.0–149.0)
VLDL: 25.8 mg/dL (ref 0.0–40.0)

## 2015-01-10 LAB — POCT URINALYSIS DIPSTICK
BILIRUBIN UA: NEGATIVE
GLUCOSE UA: NEGATIVE
KETONES UA: NEGATIVE
Leukocytes, UA: NEGATIVE
Nitrite, UA: NEGATIVE
PH UA: 5
Protein, UA: NEGATIVE
RBC UA: NEGATIVE
SPEC GRAV UA: 1.02
Urobilinogen, UA: 0.2

## 2015-01-10 LAB — HEMOGLOBIN A1C: HEMOGLOBIN A1C: 7.1 % — AB (ref 4.6–6.5)

## 2015-01-10 LAB — TSH: TSH: 0.17 u[IU]/mL — ABNORMAL LOW (ref 0.35–4.50)

## 2015-01-10 MED ORDER — LISINOPRIL 20 MG PO TABS: ORAL_TABLET | ORAL | Status: AC

## 2015-01-10 MED ORDER — SITAGLIPTIN PHOSPHATE 100 MG PO TABS: 100.0000 mg | ORAL_TABLET | Freq: Every day | ORAL | Status: AC

## 2015-01-10 MED ORDER — ASPIRIN EC 81 MG PO TBEC: 81.0000 mg | DELAYED_RELEASE_TABLET | Freq: Every day | ORAL | Status: AC

## 2015-01-10 MED ORDER — DONEPEZIL HCL 10 MG PO TABS: 10.0000 mg | ORAL_TABLET | Freq: Every day | ORAL | Status: DC

## 2015-01-10 MED ORDER — GLIPIZIDE 10 MG PO TABS: ORAL_TABLET | ORAL | Status: AC

## 2015-01-10 MED ORDER — SIMVASTATIN 40 MG PO TABS: ORAL_TABLET | ORAL | Status: DC

## 2015-01-10 MED ORDER — MEMANTINE HCL 10 MG PO TABS: 10.0000 mg | ORAL_TABLET | Freq: Two times a day (BID) | ORAL | Status: DC

## 2015-01-10 NOTE — Progress Notes (Signed)
Pre visit review using our clinic review tool, if applicable. No additional management support is needed unless otherwise documented below in the visit  Pt unable to stand long enough to get a height.

## 2015-01-10 NOTE — Addendum Note (Signed)
Addended by: Aniceto BossNIMMONS, SYLVIA A on: 01/10/2015 12:09 PM   Modules accepted: Orders

## 2015-01-10 NOTE — Progress Notes (Signed)
   Subjective:    Patient ID: Christian [name], male    DOB: [birth year], 82 y.o.   MRN: [id number]  HPI 82 yr old male wit his wife and daughter for a well exam. He has been doing very well. His BP is stable and his am fasting glucoses run in the 120s or 130s mostly. His dementia seems to be stable.    Review of Systems  Constitutional: Negative.   HENT: Negative.   Eyes: Negative.   Respiratory: Negative.   Cardiovascular: Negative.   Gastrointestinal: Negative.   Genitourinary: Negative.   Musculoskeletal: Negative.   Skin: Negative.   Neurological: Negative.   Psychiatric/Behavioral: Positive for confusion. Negative for hallucinations, dysphoric mood and agitation. The patient is not nervous/anxious and is not hyperactive.        Objective:   Physical Exam  Constitutional: He is oriented to person, place, and time. He appears well-developed and well-nourished. No distress.  HENT:  Head: Normocephalic and atraumatic.  Right Ear: External ear normal.  Left Ear: External ear normal.  Nose: Nose normal.  Mouth/Throat: Oropharynx is clear and moist. No oropharyngeal exudate.  Eyes: Conjunctivae and EOM are normal. Pupils are equal, round, and reactive to light. Right eye exhibits no discharge. Left eye exhibits no discharge. No scleral icterus.  Neck: Neck supple. No JVD present. No tracheal deviation present. No thyromegaly present.  Cardiovascular: Normal rate, regular rhythm, normal heart sounds and intact distal pulses.  Exam reveals no gallop and no friction rub.   No murmur heard. Pulmonary/Chest: Effort normal and breath sounds normal. No respiratory distress. He has no wheezes. He has no rales. He exhibits no tenderness.  Abdominal: Soft. Bowel sounds are normal. He exhibits no distension and no mass. There is no tenderness. There is no rebound and no guarding.  Musculoskeletal: Normal range of motion. He exhibits no edema or tenderness.  Lymphadenopathy:    He has no  cervical adenopathy.  Neurological: He is alert and oriented to person, place, and time. He has normal reflexes. No cranial nerve deficit. He exhibits normal muscle tone. Coordination normal.  Skin: Skin is warm and dry. No rash noted. He is not diaphoretic. No erythema. No pallor.  Psychiatric: He has a normal mood and affect. His behavior is normal. Judgment and thought content normal.          Assessment & Plan:  Well exam. We discussed diet. He cannot exercise because he is wheelchair bound. Get fasting labs today.

## 2015-05-08 ENCOUNTER — Other Ambulatory Visit: Payer: Self-pay | Admitting: Family Medicine

## 2015-06-07 ENCOUNTER — Telehealth: Payer: Self-pay | Admitting: Family Medicine

## 2015-06-07 NOTE — Telephone Encounter (Signed)
Referring message to dr Clent RidgesFry  Who is PCP

## 2015-06-07 NOTE — Telephone Encounter (Signed)
The order is ready to pick up or fax

## 2015-06-07 NOTE — Telephone Encounter (Signed)
PLEASE ADVISE THANKS.  

## 2015-06-07 NOTE — Telephone Encounter (Signed)
Wife would like to know if you could put a order in for a hospital bed with rails.

## 2015-06-11 NOTE — Telephone Encounter (Signed)
Notified pt's wife that an appointment will need to be made so we can place a referral for home health, once home health is arranged we can give them orders for a hospital bed to be delivered to the patient.

## 2015-06-13 ENCOUNTER — Telehealth: Payer: Self-pay | Admitting: Family Medicine

## 2015-06-13 ENCOUNTER — Encounter: Payer: Self-pay | Admitting: Family Medicine

## 2015-06-13 ENCOUNTER — Ambulatory Visit (INDEPENDENT_AMBULATORY_CARE_PROVIDER_SITE_OTHER): Payer: Medicare Other | Admitting: Family Medicine

## 2015-06-13 VITALS — BP 132/54 | HR 82

## 2015-06-13 DIAGNOSIS — E119 Type 2 diabetes mellitus without complications: Secondary | ICD-10-CM | POA: Diagnosis not present

## 2015-06-13 DIAGNOSIS — I6350 Cerebral infarction due to unspecified occlusion or stenosis of unspecified cerebral artery: Secondary | ICD-10-CM

## 2015-06-13 DIAGNOSIS — F0390 Unspecified dementia without behavioral disturbance: Secondary | ICD-10-CM | POA: Diagnosis not present

## 2015-06-13 DIAGNOSIS — I10 Essential (primary) hypertension: Secondary | ICD-10-CM

## 2015-06-13 NOTE — Progress Notes (Signed)
   Subjective:    Patient ID: Christian [name], male    DOB: 02-22-1921, 82 y.o.   MRN: [id number]  HPI Here to follow up on his dementia and to discuss getting a hospital bed at home. His dementia has progressed to the point that he can no longer walk. He requires assistance to dress and to have a BM or to urinate. He can no longer use a bathroom or even a bedside commode, so his wife has been using a bed pan. He cannot sleep well on a flat bed because saliva builds up in his oropharynx and chokes him. He has been sleeping in a recliner chair. Of course it is almost impossible to clean him if he has a stool while sitting in the recliner. His appetite remains good. His BP is stable. His fasting glucoses average between 120 and 140.    Review of Systems  HENT: Positive for congestion and drooling. Negative for sore throat and trouble swallowing.   Respiratory: Positive for choking. Negative for cough, chest tightness, shortness of breath and wheezing.   Cardiovascular: Negative.   Neurological: Positive for tremors and weakness.       Objective:   Physical Exam  Constitutional:  Alert but non verbal today, sitting in his wheelchair   Cardiovascular: Normal rate, regular rhythm, normal heart sounds and intact distal pulses.   Pulmonary/Chest: Effort normal and breath sounds normal.  Psychiatric:  Bright affect           Assessment & Plan:  His HTN is stable. His diabetes is stable. His dementia is steadily worsening and he is now confined to a wheelchair or to the bed. We will arrange for them to get an electric adjustable hospital type bed for the ome. This way the head can be elevated at night for sleep and it will be easier for is wife to clean and bathe him.  Nelwyn SalisburyFRY,Sedrick Tober A, MD

## 2015-06-13 NOTE — Telephone Encounter (Signed)
The rx for pt's electrice bed needs to states "semi-electric" The way the previous Rx was written , the insurance will not cover fully electric beds. Please resend.

## 2015-06-14 NOTE — Telephone Encounter (Signed)
Ready to pick up.  

## 2015-06-14 NOTE — Telephone Encounter (Signed)
Spoke to spouse. Informed Rx ready for p/u. Spouse will try to come in Monday 06/12.

## 2015-06-17 NOTE — Telephone Encounter (Signed)
On 6/9 the Rx was completed by Dr Clent RidgesFry and I faxed to [facility] #[phone].  I left a detailed message for the pts wife that this was faxed and she does not need to come to the office to pick this up.

## 2015-06-26 ENCOUNTER — Telehealth: Payer: Self-pay | Admitting: Family Medicine

## 2015-06-26 NOTE — Telephone Encounter (Signed)
Pts wife would like to see if you would help her get a home health nurse come in once a week to assist her with the care of pt.

## 2015-06-26 NOTE — Telephone Encounter (Signed)
I am sorry but Medicare will not cover for nursing visits just to help with ADLs. I suggest they look into a service like [facility] or [facility] for assistance

## 2015-06-26 NOTE — Telephone Encounter (Signed)
I spoke with spouse and went over below information.

## 2015-07-18 ENCOUNTER — Telehealth: Payer: Self-pay | Admitting: Family Medicine

## 2015-07-18 NOTE — Telephone Encounter (Signed)
Maximino SarinLayne Weaver from [facility] [phone]) called to let Dr. Clent RidgesFry know that she did a home visit today and wanted to talk to him about this patient. I advised her that dr. Clent RidgesFry was out of the office till Monday and I let her know that I will take a message on someone will call her back shortly.

## 2015-07-19 NOTE — Telephone Encounter (Signed)
I faxed paperwork and spoke with spouse.

## 2015-07-19 NOTE — Telephone Encounter (Signed)
I spoke with Christian [name], she came to home for a wellness exam today. Pt has 2 sacral wounds, both heels have wounds and a red spot on back on head. Pt asked if Christian [name] would call our office and ask for a home health nurse to come out and evaluate wounds.

## 2015-07-19 NOTE — Telephone Encounter (Signed)
I left a voice message for Christian [name] to call us back and I will call again later on today.

## 2015-07-19 NOTE — Telephone Encounter (Signed)
I gave this information to Dr. Fabian SharpPanosh and she has agreed to write order for nursing to evaluate. [facility] phone number is [phone] and fax number is [phone].

## 2015-07-23 ENCOUNTER — Telehealth: Payer: Self-pay | Admitting: Family Medicine

## 2015-07-23 NOTE — Telephone Encounter (Signed)
Per Dr. Clent RidgesFry okay to give verbal order and I did speak with Joni Reiningicole to approve wound care.

## 2015-07-23 NOTE — Telephone Encounter (Signed)
Christian [name] is calling to request verbal order wound care on buttock

## 2015-09-17 ENCOUNTER — Telehealth: Payer: Self-pay | Admitting: Family Medicine

## 2015-09-17 NOTE — Telephone Encounter (Signed)
Christian [name] would like order to see pt this week for wound care. Verbal is ok to leave on vm

## 2015-09-17 NOTE — Telephone Encounter (Signed)
Please give an order for this

## 2015-09-17 NOTE — Telephone Encounter (Signed)
Verbal order left at the [facility]icemail for [facility].

## 2015-09-20 DIAGNOSIS — Z4800 Encounter for change or removal of nonsurgical wound dressing: Secondary | ICD-10-CM

## 2015-09-20 DIAGNOSIS — E119 Type 2 diabetes mellitus without complications: Secondary | ICD-10-CM | POA: Diagnosis not present

## 2015-09-20 DIAGNOSIS — I10 Essential (primary) hypertension: Secondary | ICD-10-CM

## 2015-09-20 DIAGNOSIS — L89620 Pressure ulcer of left heel, unstageable: Secondary | ICD-10-CM | POA: Diagnosis not present

## 2015-09-20 DIAGNOSIS — F0390 Unspecified dementia without behavioral disturbance: Secondary | ICD-10-CM | POA: Diagnosis not present

## 2015-09-20 DIAGNOSIS — L89152 Pressure ulcer of sacral region, stage 2: Secondary | ICD-10-CM | POA: Diagnosis not present

## 2015-11-12 ENCOUNTER — Telehealth: Payer: Self-pay | Admitting: Family Medicine

## 2015-11-12 NOTE — Telephone Encounter (Signed)
° ° °  Marylene Landngela with [facility] call to ask for verbal orders to see  pt 1 time a week for 2 weeks    [phone] can leave a message if she does not answer

## 2015-11-13 NOTE — Telephone Encounter (Signed)
Call Christian [name] and get more information.

## 2015-11-15 NOTE — Telephone Encounter (Signed)
I left a voice message for Christian [name] to return my call, we need to know what is the reason for order?

## 2015-11-18 NOTE — Telephone Encounter (Signed)
I spoke with Christian [name] and she is going to reassess pt and will notify our office if we need to proceed with orders. Pt had a wound on sacrum and it was healing, just thought she might need to return for 2 more weeks.

## 2015-11-19 ENCOUNTER — Telehealth: Payer: Self-pay | Admitting: Family Medicine

## 2015-11-19 NOTE — Telephone Encounter (Signed)
Pt has been re-certified to home health services with [facility] per Marylene LandAngela The plan of care is  2 / wk 3 1 wk / 4  For stage 2 sacrum care, 0.5 -.0. 3 will continue with calcium alconate and  foam dressing.  Verbal ok. OK to leave message

## 2015-11-20 NOTE — Telephone Encounter (Signed)
Per Dr. Clent RidgesFry okay to give verbal orders.

## 2015-11-20 NOTE — Telephone Encounter (Signed)
I spoke with Christian [name] and gave verbal okay.

## 2015-12-09 DIAGNOSIS — L89620 Pressure ulcer of left heel, unstageable: Secondary | ICD-10-CM | POA: Diagnosis not present

## 2015-12-09 DIAGNOSIS — Z4800 Encounter for change or removal of nonsurgical wound dressing: Secondary | ICD-10-CM

## 2015-12-09 DIAGNOSIS — I10 Essential (primary) hypertension: Secondary | ICD-10-CM | POA: Diagnosis not present

## 2015-12-09 DIAGNOSIS — E119 Type 2 diabetes mellitus without complications: Secondary | ICD-10-CM | POA: Diagnosis not present

## 2015-12-09 DIAGNOSIS — F0390 Unspecified dementia without behavioral disturbance: Secondary | ICD-10-CM

## 2015-12-09 DIAGNOSIS — L89152 Pressure ulcer of sacral region, stage 2: Secondary | ICD-10-CM | POA: Diagnosis not present

## 2015-12-25 ENCOUNTER — Other Ambulatory Visit: Payer: Self-pay | Admitting: Family Medicine

## 2016-01-13 ENCOUNTER — Encounter: Payer: Medicare Other | Admitting: Family Medicine

## 2016-01-22 ENCOUNTER — Other Ambulatory Visit: Payer: Self-pay | Admitting: Family Medicine

## 2016-01-23 ENCOUNTER — Other Ambulatory Visit: Payer: Self-pay | Admitting: Family Medicine

## 2016-02-24 ENCOUNTER — Encounter: Payer: Medicare Other | Admitting: Family Medicine

## 2016-03-05 DEATH — deceased

## 2016-09-24 ENCOUNTER — Encounter: Payer: Self-pay | Admitting: Family Medicine
